# Patient Record
Sex: Female | Born: 1980 | Race: White | Hispanic: No | Marital: Single | Smoking: Current some day smoker
Health system: Southern US, Community
[De-identification: ages and names within clinical notes are randomized; demographics above are authoritative.]

## PROBLEM LIST (undated history)

## (undated) DIAGNOSIS — F419 Anxiety disorder, unspecified: Secondary | ICD-10-CM

## (undated) DIAGNOSIS — G5600 Carpal tunnel syndrome, unspecified upper limb: Secondary | ICD-10-CM

## (undated) DIAGNOSIS — R87629 Unspecified abnormal cytological findings in specimens from vagina: Secondary | ICD-10-CM

## (undated) DIAGNOSIS — IMO0002 Reserved for concepts with insufficient information to code with codable children: Secondary | ICD-10-CM

## (undated) DIAGNOSIS — S129XXA Fracture of neck, unspecified, initial encounter: Secondary | ICD-10-CM

## (undated) DIAGNOSIS — R87619 Unspecified abnormal cytological findings in specimens from cervix uteri: Secondary | ICD-10-CM

## (undated) DIAGNOSIS — B009 Herpesviral infection, unspecified: Secondary | ICD-10-CM

## (undated) HISTORY — PX: NO PAST SURGERIES: SHX2092

## (undated) HISTORY — DX: Unspecified abnormal cytological findings in specimens from cervix uteri: R87.619

## (undated) HISTORY — DX: Reserved for concepts with insufficient information to code with codable children: IMO0002

## (undated) HISTORY — DX: Fracture of neck, unspecified, initial encounter: S12.9XXA

## (undated) HISTORY — DX: Anxiety disorder, unspecified: F41.9

---
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
## 2003-02-24 DIAGNOSIS — S129XXA Fracture of neck, unspecified, initial encounter: Secondary | ICD-10-CM

## 2003-02-24 HISTORY — DX: Fracture of neck, unspecified, initial encounter: S12.9XXA

## 2003-08-09 ENCOUNTER — Emergency Department (HOSPITAL_COMMUNITY): Admission: EM | Admit: 2003-08-09 | Discharge: 2003-08-09 | Payer: Self-pay | Admitting: Emergency Medicine

## 2004-01-22 ENCOUNTER — Ambulatory Visit: Payer: Self-pay | Admitting: Family Medicine

## 2004-11-25 ENCOUNTER — Other Ambulatory Visit: Admission: RE | Admit: 2004-11-25 | Discharge: 2004-11-25 | Payer: Self-pay | Admitting: Family Medicine

## 2005-11-17 ENCOUNTER — Emergency Department (HOSPITAL_COMMUNITY): Admission: EM | Admit: 2005-11-17 | Discharge: 2005-11-17 | Payer: Self-pay | Admitting: Emergency Medicine

## 2007-02-25 ENCOUNTER — Inpatient Hospital Stay (HOSPITAL_COMMUNITY): Admission: EM | Admit: 2007-02-25 | Discharge: 2007-02-27 | Payer: Self-pay | Admitting: Emergency Medicine

## 2007-05-22 ENCOUNTER — Inpatient Hospital Stay (HOSPITAL_COMMUNITY): Admission: EM | Admit: 2007-05-22 | Discharge: 2007-05-22 | Payer: Self-pay | Admitting: Emergency Medicine

## 2007-06-01 ENCOUNTER — Emergency Department (HOSPITAL_COMMUNITY): Admission: EM | Admit: 2007-06-01 | Discharge: 2007-06-01 | Payer: Self-pay | Admitting: Emergency Medicine

## 2008-01-17 ENCOUNTER — Emergency Department (HOSPITAL_COMMUNITY): Admission: EM | Admit: 2008-01-17 | Discharge: 2008-01-17 | Payer: Self-pay | Admitting: Emergency Medicine

## 2008-04-15 ENCOUNTER — Inpatient Hospital Stay (HOSPITAL_COMMUNITY): Admission: AD | Admit: 2008-04-15 | Discharge: 2008-04-15 | Payer: Self-pay | Admitting: Obstetrics and Gynecology

## 2008-04-15 ENCOUNTER — Inpatient Hospital Stay (HOSPITAL_COMMUNITY): Admission: AD | Admit: 2008-04-15 | Discharge: 2008-04-17 | Payer: Self-pay | Admitting: Obstetrics and Gynecology

## 2010-06-08 ENCOUNTER — Emergency Department (HOSPITAL_COMMUNITY)
Admission: EM | Admit: 2010-06-08 | Discharge: 2010-06-08 | Discharge status: Against Medical Advice | Payer: Medicaid Other | Attending: Emergency Medicine | Admitting: Emergency Medicine

## 2010-06-08 DIAGNOSIS — M542 Cervicalgia: Secondary | ICD-10-CM | POA: Insufficient documentation

## 2010-06-10 LAB — CBC
HCT: 33.6 % — ABNORMAL LOW (ref 36.0–46.0)
HCT: 39.4 % (ref 36.0–46.0)
Hemoglobin: 13.5 g/dL (ref 12.0–15.0)
MCHC: 34.4 g/dL (ref 30.0–36.0)
MCHC: 35.2 g/dL (ref 30.0–36.0)
MCV: 95.5 fL (ref 78.0–100.0)
RBC: 3.52 MIL/uL — ABNORMAL LOW (ref 3.87–5.11)
RBC: 4.17 MIL/uL (ref 3.87–5.11)
RDW: 12.3 % (ref 11.5–15.5)
WBC: 13.4 10*3/uL — ABNORMAL HIGH (ref 4.0–10.5)
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
## 2010-10-26 ENCOUNTER — Emergency Department (HOSPITAL_COMMUNITY)
Admission: EM | Admit: 2010-10-26 | Discharge: 2010-10-26 | Disposition: A | Discharge status: Home / Self Care | Payer: Medicaid Other | Attending: Emergency Medicine | Admitting: Emergency Medicine

## 2010-10-26 DIAGNOSIS — F3289 Other specified depressive episodes: Secondary | ICD-10-CM | POA: Insufficient documentation

## 2010-10-26 DIAGNOSIS — F329 Major depressive disorder, single episode, unspecified: Secondary | ICD-10-CM | POA: Insufficient documentation

## 2010-10-26 DIAGNOSIS — F1010 Alcohol abuse, uncomplicated: Secondary | ICD-10-CM | POA: Insufficient documentation

## 2010-10-26 DIAGNOSIS — S51809A Unspecified open wound of unspecified forearm, initial encounter: Secondary | ICD-10-CM | POA: Insufficient documentation

## 2010-10-26 DIAGNOSIS — W268XXA Contact with other sharp object(s), not elsewhere classified, initial encounter: Secondary | ICD-10-CM | POA: Insufficient documentation

## 2010-11-13 LAB — BASIC METABOLIC PANEL
BUN: 9
CO2: 27
Calcium: 9.6
Chloride: 103
Creatinine, Ser: 0.61
GFR calc Af Amer: >60

## 2010-11-13 LAB — CBC
MCHC: 35
MCV: 92.2
Platelets: 307
RBC: 4.18
RDW: 12.1

## 2010-11-13 LAB — PROTIME-INR
INR: 1
Prothrombin Time: 12.9

## 2010-11-13 LAB — TYPE AND SCREEN: Antibody Screen: NEGATIVE

## 2010-11-17 LAB — BASIC METABOLIC PANEL
BUN: 3 — ABNORMAL LOW
CO2: 25
Calcium: 9.1
Creatinine, Ser: 0.72
GFR calc non Af Amer: >60
Glucose, Bld: 87
Sodium: 143

## 2010-11-17 LAB — RAPID URINE DRUG SCREEN, HOSP PERFORMED
Amphetamines: NOT DETECTED
Barbiturates: NOT DETECTED
Benzodiazepines: POSITIVE — AB
Cocaine: NOT DETECTED
Opiates: NOT DETECTED
Tetrahydrocannabinol: NOT DETECTED

## 2010-11-17 LAB — DIFFERENTIAL
Basophils Absolute: 0
Basophils Relative: 0
Lymphocytes Relative: 40
Monocytes Absolute: 0.3
Neutro Abs: 3.1

## 2010-11-17 LAB — ACETAMINOPHEN LEVEL: Acetaminophen (Tylenol), Serum: <10 — ABNORMAL LOW

## 2010-11-17 LAB — CBC
Hemoglobin: 13.2
MCHC: 35.5
Platelets: 272
RDW: 11.9

## 2010-11-18 LAB — POCT PREGNANCY, URINE: Operator id: 239701

## 2011-02-24 NOTE — L&D Delivery Note (Signed)
Delivery Note At 4:38 PM a viable and healthy female was delivered via Vaginal, Spontaneous Delivery (Presentation: Right Occiput Anterior).  APGAR: 3, 7; weight 6 lb 9.4 oz (2989 g).   Placenta status: Intact, Spontaneous.  Cord: 3 vessels with the following complications: None.  Cord pH: 7.22.  Mother had taken Valium and Xanax prior to coming in today in labor.  She received IV Stadol during labor.  Baby was vigorous at birth but developed secondary apnea at 1 minute and required resuscitation by NICU team.  Anesthesia: Epidural  Episiotomy: None Lacerations: None Est. Blood Loss (mL): 300  Mom to postpartum.  Baby to NICU for observation.  Luiz Trumpower D 10/31/2011, 7:43 PM

## 2011-04-15 ENCOUNTER — Other Ambulatory Visit: Payer: Self-pay

## 2011-04-15 LAB — OB RESULTS CONSOLE GC/CHLAMYDIA
Chlamydia: NEGATIVE
Gonorrhea: NEGATIVE

## 2011-04-15 LAB — OB RESULTS CONSOLE RPR: RPR: NONREACTIVE

## 2011-04-15 LAB — OB RESULTS CONSOLE HIV ANTIBODY (ROUTINE TESTING): HIV: NONREACTIVE

## 2011-04-15 LAB — OB RESULTS CONSOLE HEPATITIS B SURFACE ANTIGEN: Hepatitis B Surface Ag: NEGATIVE

## 2011-04-15 LAB — OB RESULTS CONSOLE ABO/RH

## 2011-04-20 ENCOUNTER — Other Ambulatory Visit (HOSPITAL_COMMUNITY): Payer: Self-pay | Admitting: Obstetrics and Gynecology

## 2011-04-20 DIAGNOSIS — O289 Unspecified abnormal findings on antenatal screening of mother: Secondary | ICD-10-CM

## 2011-04-22 ENCOUNTER — Ambulatory Visit (HOSPITAL_COMMUNITY): Payer: Medicaid Other

## 2011-04-22 ENCOUNTER — Ambulatory Visit (HOSPITAL_COMMUNITY): Payer: Medicaid Other | Attending: Obstetrics and Gynecology

## 2011-04-29 ENCOUNTER — Ambulatory Visit (HOSPITAL_COMMUNITY): Payer: Medicaid Other | Attending: Obstetrics and Gynecology

## 2011-04-29 ENCOUNTER — Ambulatory Visit (HOSPITAL_COMMUNITY): Payer: Medicaid Other

## 2011-05-13 ENCOUNTER — Other Ambulatory Visit (HOSPITAL_COMMUNITY): Payer: Self-pay | Admitting: Obstetrics and Gynecology

## 2011-05-13 DIAGNOSIS — O289 Unspecified abnormal findings on antenatal screening of mother: Secondary | ICD-10-CM

## 2011-05-19 ENCOUNTER — Ambulatory Visit (HOSPITAL_COMMUNITY)
Admission: RE | Admit: 2011-05-19 | Discharge: 2011-05-19 | Disposition: A | Discharge status: Home / Self Care | Payer: Medicaid Other | Source: Ambulatory Visit | Attending: Obstetrics and Gynecology | Admitting: Obstetrics and Gynecology

## 2011-05-19 ENCOUNTER — Other Ambulatory Visit (HOSPITAL_COMMUNITY): Payer: Self-pay | Admitting: Obstetrics and Gynecology

## 2011-05-19 VITALS — BP 97/56 | HR 92 | Wt 129.0 lb

## 2011-05-19 DIAGNOSIS — O289 Unspecified abnormal findings on antenatal screening of mother: Secondary | ICD-10-CM | POA: Insufficient documentation

## 2011-05-19 DIAGNOSIS — Z363 Encounter for antenatal screening for malformations: Secondary | ICD-10-CM | POA: Insufficient documentation

## 2011-05-19 DIAGNOSIS — O358XX0 Maternal care for other (suspected) fetal abnormality and damage, not applicable or unspecified: Secondary | ICD-10-CM | POA: Insufficient documentation

## 2011-05-19 DIAGNOSIS — Z1389 Encounter for screening for other disorder: Secondary | ICD-10-CM | POA: Insufficient documentation

## 2011-05-19 NOTE — Progress Notes (Signed)
Obstetric ultrasound performed today.  Reassuring fetal findings.  Some limitations due to early gestational age.   Patient also received genetic counseling (see separate report).  She declined further testing for fetal chromosome abnormalities at this time, but may wish to consider these again at her next ultrasound appointment.    The option of fetal echo was also discussed today due to increased nuchal translucency measurements (98th %tile for gestational age).  She is considering this option as well.   Patient also expressed some increased symptoms of anxiety.  She has taken Xanax in the past but reports avoiding this since the diagnosis of her pregnancy.  Information was provided regarding community mental health services and she was strongly advised to establish care with a mental health provider.  Concerning signs and symptoms were reviewed and precautions given.  Patient denies suicidal or homicidal ideation today.   Follow up ultrasound exam scheduled in 3 weeks.    Please see full report in [facility].

## 2011-05-19 NOTE — Progress Notes (Signed)
Genetic Counseling  High-Risk Gestation Note  Appointment Date:  05/19/2011 Referred By: Levi Aland, MD Date of Birth:  04/23/1980 Partner:  Odette Fraction Attending: Rica Koyanagi, MD   [name] and her partner, Mr. Odette Fraction, were seen for genetic counseling because of an increased risk for fetal Down syndrome based on First Trimester Screening performed through [facility].  They were counseled regarding the First trimester screen result and the associated 1 in 216 risk for fetal Down syndrome.  In addition, we reviewed the screen adjusted increase in risk for trisomy 42 (1 in 219) but discussed that this is considered in the screen negative range. We reviewed chromosomes, nondisjunction, and the common features and variable prognosis of Down syndrome as well as features and poor prognosis of trisomy 2.  We also discussed other explanations for a screen positive result including: a gestational dating error, differences in maternal metabolism, and normal variation. We specifically reviewed that the nuchal translucency measurement obtained for her first trimester screen was reported to be at the 98%tile.   We reviewed the various common etiologies for an increased NT including: aneuploidy, single gene conditions, cardiac or great vessel abnormalities, lymphatic system failure, decreased fetal movement, and fetal anemia.  We reviewed that the associated risk for a cardiac defect is ~1% based on the reported NT measurement.  We also discussed single gene conditions and that these conditions are not routinely tested for prenatally unless ultrasound findings or family history significantly increase the suspicion of a specific single gene disorder.    We reviewed other available screening options including detailed ultrasound and fetal echocardiogram. We discussed another type of screening test, noninvasive prenatal testing (NIPT), which utilizes cell free fetal DNA found in the maternal  circulation. This test is not diagnostic for chromosome conditions, but can provide information regarding the presence or absence of extra fetal DNA for chromosomes 13, 18 and 21. Thus, it would not identify or rule out all fetal aneuploidy. The reported detection rate is greater than 99% for Trisomy 21, greater than 97% for Trisomy 18, and is approximately 80% (8 out of 10) for Trisomy 13. The false positive rate is reported to be less than 1% for any of these conditions. We also reviewed the available diagnostic option of amniocentesis. A risk of 1 in 200-300 was given for amniocentesis, the primary concern being risk for spontaneous pregnancy loss. We discussed the risks, limitations, and benefits of each.    After thoughtful consideration of these options, Elaine Wallace elected to have ultrasound, but declined amniocentesis and cell free fetal DNA testing at this time. She is further considering cell free fetal DNA testing ([facility]) and will contact our office should she elect to pursue this testing. She understands that ultrasound cannot rule out all birth defects or genetic syndromes.  The patient was advised of this limitation and states she still does not want diagnostic testing at this time.   However, they were counseled that 50-80% of fetuses with Down syndrome, when well visualized, have detectable anomalies or soft markers by ultrasound.  A complete ultrasound was performed today.  The ultrasound report will be sent under separate cover. Given early gestational age, a follow-up ultrasound was planned on 06/03/11 to complete fetal anatomic survey. Fetal echocardiogram is also available to the patient in the pregnancy, if desired, given the first trimester increased nuchal translucency measurement.   Elaine Wallace was provided with written information regarding cystic fibrosis (CF) including the carrier frequency  and incidence in the Caucasian population, the availability of carrier testing and  prenatal diagnosis if indicated.  In addition, we discussed that CF is routinely screened for as part of the [place] newborn screening panel.  She declined testing today.   Both family histories were reviewed and found to be noncontributory for birth defects, mental retardation, recurrent pregnancy loss, and known genetic conditions. Without further information regarding the provided family history, an accurate genetic risk cannot be calculated. Further genetic counseling is warranted if more information is obtained. The father of the pregnancy is 38 years old. There is an approximate 0.5% risk for the fetus to have a new autosomal dominant gene mutation related to a paternal age of over 36. There is a wide range of conditions which can be caused by new dominant gene mutations.  However, genetic testing for advanced paternal age is not available at this time.  Elaine Wallace denied exposure to environmental toxins. She denied the use of alcohol, tobacco or street drugs. She denied significant viral illnesses during the course of her pregnancy. Her medical and surgical histories were contributory for anxiety, for which she has taken xanax during the pregnancy. Available animal study data do not suggest an increased risk for birth defects with prenatal alprazolam (Xanax) use, except at very high dose exposure. Available human data regarding Xanax use in pregnancy have not indicated an increased risk for birth defects. We reviewed that other benzodiazepines have been suggested to increase the risk for birth defects, such as cleft lip and palate. However, this association has not been proven. We discussed that third trimester use of alprazolam is associated with an increased risk for neonatal withdrawal symptoms. Even though a limited number of medicines are known to cause birth defects, we cannot say that it is completely safe to use any medicines during pregnancy.  It is also not possible to predict any  drug-drug interactions that occur, or how they might affect a pregnancy.  The use of medications is recommended in pregnancy only if the benefit to the mother (and thus the pregnancy) outweighs potential risks to the baby.  Sometimes the maternal use of medications, dictated by a medical condition, may even be more beneficial to a pregnancy than not taking the medication(s) at all. Ms. Fotopoulos reported that she currently does not have a medical professional to help with her anxiety management. We provided Ms. Yehuda Mao with contact information for [facility] in [place], which offers outpatient services for treatment of anxiety.   I counseled this couple for approximately 40 minutes regarding the above risks and available options.     Quinn Plowman, MS,  Certified [employer] 05/19/2011

## 2011-05-25 ENCOUNTER — Encounter (HOSPITAL_COMMUNITY): Payer: Self-pay

## 2011-06-03 ENCOUNTER — Ambulatory Visit (HOSPITAL_COMMUNITY): Payer: Medicaid Other | Attending: Obstetrics and Gynecology

## 2011-10-15 ENCOUNTER — Encounter (HOSPITAL_COMMUNITY): Payer: Self-pay | Admitting: *Deleted

## 2011-10-15 ENCOUNTER — Telehealth (HOSPITAL_COMMUNITY): Payer: Self-pay | Admitting: *Deleted

## 2011-10-15 NOTE — Telephone Encounter (Signed)
Preadmission screen  

## 2011-10-31 ENCOUNTER — Inpatient Hospital Stay (HOSPITAL_COMMUNITY)
Admission: AD | Admit: 2011-10-31 | Discharge: 2011-11-02 | DRG: 775 | Disposition: A | Discharge status: Home / Self Care | Payer: Medicaid Other | Source: Ambulatory Visit | Attending: Obstetrics & Gynecology | Admitting: Obstetrics & Gynecology

## 2011-10-31 ENCOUNTER — Encounter (HOSPITAL_COMMUNITY): Payer: Self-pay | Admitting: Anesthesiology

## 2011-10-31 ENCOUNTER — Encounter (HOSPITAL_COMMUNITY): Payer: Self-pay

## 2011-10-31 ENCOUNTER — Inpatient Hospital Stay (HOSPITAL_COMMUNITY): Payer: Medicaid Other | Admitting: Anesthesiology

## 2011-10-31 DIAGNOSIS — Z3480 Encounter for supervision of other normal pregnancy, unspecified trimester: Secondary | ICD-10-CM

## 2011-10-31 LAB — CBC
Hemoglobin: 10.7 g/dL — ABNORMAL LOW (ref 12.0–15.0)
MCHC: 33.2 g/dL (ref 30.0–36.0)
Platelets: 175 10*3/uL (ref 150–400)
RBC: 3.57 MIL/uL — ABNORMAL LOW (ref 3.87–5.11)

## 2011-10-31 LAB — RPR: RPR Ser Ql: NONREACTIVE

## 2011-10-31 MED ORDER — LACTATED RINGERS IV SOLN
500.0000 mL | Freq: Once | INTRAVENOUS | Status: AC
  Administered 2011-10-31: 15:00:00 via INTRAVENOUS

## 2011-10-31 MED ORDER — LIDOCAINE HCL (PF) 1 % IJ SOLN
30.0000 mL | INTRAMUSCULAR | Status: DC | PRN
  Filled 2011-10-31: qty 30

## 2011-10-31 MED ORDER — EPHEDRINE 5 MG/ML INJ: 10.0000 mg | INTRAVENOUS | Status: DC | PRN

## 2011-10-31 MED ORDER — CITRIC ACID-SODIUM CITRATE 334-500 MG/5ML PO SOLN: 30.0000 mL | ORAL | Status: DC | PRN

## 2011-10-31 MED ORDER — ONDANSETRON HCL 4 MG/2ML IJ SOLN: 4.0000 mg | INTRAMUSCULAR | Status: DC | PRN

## 2011-10-31 MED ORDER — ALPRAZOLAM 0.25 MG PO TABS
0.2500 mg | ORAL_TABLET | Freq: Every day | ORAL | Status: DC | PRN
  Administered 2011-11-02: 0.25 mg via ORAL
  Filled 2011-10-31: qty 1

## 2011-10-31 MED ORDER — LACTATED RINGERS IV SOLN
500.0000 mL | INTRAVENOUS | Status: DC | PRN
  Administered 2011-10-31: 1000 mL via INTRAVENOUS

## 2011-10-31 MED ORDER — ONDANSETRON HCL 4 MG/2ML IJ SOLN: 4.0000 mg | Freq: Four times a day (QID) | INTRAMUSCULAR | Status: DC | PRN

## 2011-10-31 MED ORDER — ZOLPIDEM TARTRATE 5 MG PO TABS: 5.0000 mg | ORAL_TABLET | Freq: Every evening | ORAL | Status: DC | PRN

## 2011-10-31 MED ORDER — LACTATED RINGERS IV SOLN: INTRAVENOUS | Status: DC

## 2011-10-31 MED ORDER — ONDANSETRON HCL 4 MG PO TABS: 4.0000 mg | ORAL_TABLET | ORAL | Status: DC | PRN

## 2011-10-31 MED ORDER — PHENYLEPHRINE 40 MCG/ML (10ML) SYRINGE FOR IV PUSH (FOR BLOOD PRESSURE SUPPORT)
80.0000 ug | PREFILLED_SYRINGE | INTRAVENOUS | Status: DC | PRN
  Administered 2011-10-31: 80 ug via INTRAVENOUS
  Filled 2011-10-31: qty 5

## 2011-10-31 MED ORDER — EPHEDRINE 5 MG/ML INJ
10.0000 mg | INTRAVENOUS | Status: DC | PRN
  Filled 2011-10-31: qty 4

## 2011-10-31 MED ORDER — NALOXONE HCL 0.4 MG/ML IJ SOLN
INTRAMUSCULAR | Status: AC
  Filled 2011-10-31: qty 1

## 2011-10-31 MED ORDER — LANOLIN HYDROUS EX OINT: TOPICAL_OINTMENT | CUTANEOUS | Status: DC | PRN

## 2011-10-31 MED ORDER — LIDOCAINE HCL (PF) 1 % IJ SOLN
INTRAMUSCULAR | Status: DC | PRN
  Administered 2011-10-31 (×4): 4 mL

## 2011-10-31 MED ORDER — WITCH HAZEL-GLYCERIN EX PADS: 1.0000 "application " | MEDICATED_PAD | CUTANEOUS | Status: DC | PRN

## 2011-10-31 MED ORDER — SIMETHICONE 80 MG PO CHEW: 80.0000 mg | CHEWABLE_TABLET | ORAL | Status: DC | PRN

## 2011-10-31 MED ORDER — DIPHENHYDRAMINE HCL 25 MG PO CAPS: 25.0000 mg | ORAL_CAPSULE | Freq: Four times a day (QID) | ORAL | Status: DC | PRN

## 2011-10-31 MED ORDER — IBUPROFEN 600 MG PO TABS
600.0000 mg | ORAL_TABLET | Freq: Four times a day (QID) | ORAL | Status: DC | PRN
  Administered 2011-10-31: 600 mg via ORAL
  Filled 2011-10-31: qty 1

## 2011-10-31 MED ORDER — BENZOCAINE-MENTHOL 20-0.5 % EX AERO: 1.0000 "application " | INHALATION_SPRAY | CUTANEOUS | Status: DC | PRN

## 2011-10-31 MED ORDER — FLEET ENEMA 7-19 GM/118ML RE ENEM: 1.0000 | ENEMA | RECTAL | Status: DC | PRN

## 2011-10-31 MED ORDER — DIPHENHYDRAMINE HCL 50 MG/ML IJ SOLN: 12.5000 mg | INTRAMUSCULAR | Status: DC | PRN

## 2011-10-31 MED ORDER — TETANUS-DIPHTH-ACELL PERTUSSIS 5-2.5-18.5 LF-MCG/0.5 IM SUSP
0.5000 mL | Freq: Once | INTRAMUSCULAR | Status: AC
  Administered 2011-11-02: 0.5 mL via INTRAMUSCULAR

## 2011-10-31 MED ORDER — BUTORPHANOL TARTRATE 1 MG/ML IJ SOLN
1.0000 mg | INTRAMUSCULAR | Status: DC | PRN
  Administered 2011-10-31 (×2): 1 mg via INTRAVENOUS
  Filled 2011-10-31 (×2): qty 1

## 2011-10-31 MED ORDER — OXYCODONE-ACETAMINOPHEN 5-325 MG PO TABS: 1.0000 | ORAL_TABLET | ORAL | Status: DC | PRN

## 2011-10-31 MED ORDER — DIBUCAINE 1 % RE OINT: 1.0000 "application " | TOPICAL_OINTMENT | RECTAL | Status: DC | PRN

## 2011-10-31 MED ORDER — IBUPROFEN 600 MG PO TABS
600.0000 mg | ORAL_TABLET | Freq: Four times a day (QID) | ORAL | Status: DC
  Administered 2011-10-31 – 2011-11-02 (×6): 600 mg via ORAL
  Filled 2011-10-31 (×6): qty 1

## 2011-10-31 MED ORDER — OXYTOCIN BOLUS FROM INFUSION
500.0000 mL | Freq: Once | INTRAVENOUS | Status: DC
  Filled 2011-10-31: qty 500

## 2011-10-31 MED ORDER — PHENYLEPHRINE 40 MCG/ML (10ML) SYRINGE FOR IV PUSH (FOR BLOOD PRESSURE SUPPORT): 80.0000 ug | PREFILLED_SYRINGE | INTRAVENOUS | Status: DC | PRN

## 2011-10-31 MED ORDER — FENTANYL 2.5 MCG/ML BUPIVACAINE 1/10 % EPIDURAL INFUSION (WH - ANES)
14.0000 mL/h | INTRAMUSCULAR | Status: DC
  Administered 2011-10-31: 14 mL/h via EPIDURAL
  Filled 2011-10-31: qty 60

## 2011-10-31 MED ORDER — OXYTOCIN 40 UNITS IN LACTATED RINGERS INFUSION - SIMPLE MED
62.5000 mL/h | Freq: Once | INTRAVENOUS | Status: AC
  Administered 2011-10-31: 62.5 mL/h via INTRAVENOUS
  Filled 2011-10-31: qty 1000

## 2011-10-31 MED ORDER — SENNOSIDES-DOCUSATE SODIUM 8.6-50 MG PO TABS
2.0000 | ORAL_TABLET | Freq: Every day | ORAL | Status: DC
  Administered 2011-10-31 – 2011-11-01 (×2): 2 via ORAL

## 2011-10-31 MED ORDER — PRENATAL MULTIVITAMIN CH
1.0000 | ORAL_TABLET | Freq: Every day | ORAL | Status: DC
  Administered 2011-11-01 – 2011-11-02 (×2): 1 via ORAL
  Filled 2011-10-31 (×2): qty 1

## 2011-10-31 MED ORDER — ACETAMINOPHEN 325 MG PO TABS: 650.0000 mg | ORAL_TABLET | ORAL | Status: DC | PRN

## 2011-10-31 NOTE — Progress Notes (Signed)
Delivery of live viable female, NICU called to delivery. APGARS 3,7

## 2011-10-31 NOTE — H&P (Addendum)
31 y.o. G2P1001  Estimated Date of Delivery: 10/29/11 admitted at 40/[redacted] weeks gestation in labor.  Prenatal Transfer Tool  Maternal Diabetes: No Genetic Screening: Abnormal:  Results: Elevated risk of Trisomy 21, normal ultrasound at [facility]. Maternal Ultrasounds/Referrals: Normal Fetal Ultrasounds or other Referrals:  Referred to Materal Fetal Medicine , normal - no markers for aneuploidy. Maternal Substance Abuse:  No Significant Maternal Medications:  Meds include: Other: xanax in first trimester. Significant Maternal Lab Results: Lab values include: Rh negative, received RhoGAM at 28 weeks. Other Significant Pregnancy Complications:  None  Afebrile, VSS Heart and Lungs: No active disease Abdomen: soft, gravid, EFW AGA. Cervical exam:  6/90  Impression: Active labor  Plan:  Admit to [facility].
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
## 2011-10-31 NOTE — Anesthesia Procedure Notes (Signed)
Epidural Patient location during procedure: OB Start time: 10/31/2011 3:27 PM  Staffing Performed by: anesthesiologist   Preanesthetic Checklist Completed: patient identified, site marked, surgical consent, pre-op evaluation, timeout performed, IV checked, risks and benefits discussed and monitors and equipment checked  Epidural Patient position: sitting Prep: site prepped and draped and DuraPrep Patient monitoring: continuous pulse ox and blood pressure Approach: midline Injection technique: LOR air  Needle:  Needle type: Tuohy  Needle gauge: 17 G Needle length: 9 cm and 9 Needle insertion depth: 5 cm cm Catheter type: closed end flexible Catheter size: 19 Gauge Catheter at skin depth: 10 cm Test dose: negative  Assessment Events: blood not aspirated, injection not painful, no injection resistance, negative IV test and no paresthesia  Additional Notes Patient declines discussion of risks and benefits.  Jasmine December, MDReason for block:procedure for pain

## 2011-11-01 ENCOUNTER — Encounter (HOSPITAL_COMMUNITY): Payer: Self-pay | Admitting: *Deleted

## 2011-11-01 LAB — CBC
MCHC: 33.3 g/dL (ref 30.0–36.0)
Platelets: 161 10*3/uL (ref 150–400)
RDW: 13.9 % (ref 11.5–15.5)

## 2011-11-01 MED ORDER — RHO D IMMUNE GLOBULIN 1500 UNIT/2ML IJ SOLN
300.0000 ug | Freq: Once | INTRAMUSCULAR | Status: AC
  Administered 2011-11-01: 300 ug via INTRAMUSCULAR
  Filled 2011-11-01: qty 2

## 2011-11-01 MED ORDER — HYDROCODONE-ACETAMINOPHEN 5-325 MG PO TABS
1.0000 | ORAL_TABLET | ORAL | Status: DC | PRN
  Administered 2011-11-01: 1 via ORAL
  Administered 2011-11-01: 2 via ORAL
  Administered 2011-11-01: 1 via ORAL
  Filled 2011-11-01 (×2): qty 2
  Filled 2011-11-01: qty 1
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
## 2011-11-01 NOTE — Anesthesia Postprocedure Evaluation (Signed)
  Anesthesia Post-op Note  Patient: Elaine Wallace  Procedure(s) Performed: * No procedures listed *  Patient Location: Mother/Baby  Anesthesia Type: Epidural  Level of Consciousness: awake, alert  and oriented  Airway and Oxygen Therapy: Patient Spontanous Breathing  Post-op Pain: mild  Post-op Assessment: Patient's Cardiovascular Status Stable, Respiratory Function Stable, Patent Airway, No signs of Nausea or vomiting and Pain level controlled  Post-op Vital Signs: stable  Complications: none

## 2011-11-01 NOTE — Progress Notes (Signed)
Post Partum Day 1 Subjective: no complaints  Objective: Blood pressure 89/62, pulse 63, temperature 97.8 F (36.6 C), temperature source Oral, resp. rate 18, height 5\' 4"  (1.626 m), weight 64.864 kg (143 lb), SpO2 99.00%, unknown if currently breastfeeding.  Physical Exam:  General: alert Lochia: appropriate Uterine Fundus: firm   Basename 11/01/11 0540 10/31/11 1415  HGB 9.6* 10.7*  HCT 28.8* 32.2*    Assessment/Plan: Plan for discharge tomorrow.  Baby doing well and may leave NICU today.   LOS: 1 day   Raylan Troiani D 11/01/2011, 11:31 AM

## 2011-11-02 LAB — RH IG WORKUP (INCLUDES ABO/RH)
ABO/RH(D): O NEG
Gestational Age(Wks): 40

## 2011-11-02 MED ORDER — HYDROCODONE-ACETAMINOPHEN 5-325 MG PO TABS: 1.0000 | ORAL_TABLET | ORAL | Status: AC | PRN

## 2011-11-02 NOTE — Progress Notes (Signed)
Pt with  questions about PP Depression.  Talked to pt about reaching out to others if she feels depressed, not to hesitate to call her doctors with any questions or  Concerns. .  Resources given at d/c, pt verbalizes understanding of d/c instructions.  Pt explains she was anxious during her pregnancy, she had broken her foot and was on pain medication for that.  She was worried she had compromised her newborn's health.  She verbalized she did not want to continue on the Xanax, b/c she wanted to breast feed her baby.  She stated she would call her md, if she started to feel anxious.  She did express financial dificulties and wanted me to give her some medications to take with her.  I told her I was unable to do that.  She said she would go by the office to get her prescription signed by Dr. Dareen Piano.                  MT Sherrell Weir RN

## 2011-11-02 NOTE — Discharge Summary (Signed)
Obstetric Discharge Summary Reason for Admission: onset of labor Prenatal Procedures: ultrasound Intrapartum Procedures: spontaneous vaginal delivery Postpartum Procedures: none Complications-Operative and Postpartum: none Hemoglobin  Date Value Range Status  11/01/2011 9.6* 12.0 - 15.0 g/dL Final     HCT  Date Value Range Status  11/01/2011 28.8* 36.0 - 46.0 % Final    Physical Exam:  General: alert Lochia: appropriate Uterine Fundus: firm   Discharge Diagnoses: Term Pregnancy-delivered  Discharge Information: Date: 11/02/2011 Activity: pelvic rest Diet: routine Medications: PNV, Ibuprofen and Vicodin Condition: stable Instructions: refer to practice specific booklet Discharge to: home Follow-up Information    Follow up with Mickel Baas, MD. Schedule an appointment as soon as possible for a visit in 4 weeks.   Contact information:   [address] [place] [address] [phone]          Newborn Data: Live born female  Birth Weight: 6 lb 9.4 oz (2989 g) APGAR: 3, 7  Home with mother.  ANDERSON,MARK E 11/02/2011, 12:31 PM

## 2011-11-02 NOTE — Progress Notes (Signed)
PPD#2 Pt and baby are stable. Mother ready to go home. VSSAF Lochia-mild IMP/ s/p SVD PLAN/ Will discharge.

## 2011-11-02 NOTE — Progress Notes (Signed)
Chart review completed.

## 2011-11-03 LAB — TYPE AND SCREEN
ABO/RH(D): O NEG
Unit division: 0

## 2011-12-29 ENCOUNTER — Ambulatory Visit: Payer: Medicaid Other | Admitting: Family Medicine

## 2011-12-29 DIAGNOSIS — Z0289 Encounter for other administrative examinations: Secondary | ICD-10-CM

## 2013-05-15 ENCOUNTER — Encounter (HOSPITAL_COMMUNITY): Payer: Self-pay | Admitting: Emergency Medicine

## 2013-05-15 ENCOUNTER — Emergency Department (HOSPITAL_COMMUNITY): Payer: Medicaid Other

## 2013-05-15 ENCOUNTER — Emergency Department (HOSPITAL_COMMUNITY)
Admission: EM | Admit: 2013-05-15 | Discharge: 2013-05-16 | Disposition: A | Discharge status: Home / Self Care | Payer: Medicaid Other | Attending: Emergency Medicine | Admitting: Emergency Medicine

## 2013-05-15 DIAGNOSIS — Z3202 Encounter for pregnancy test, result negative: Secondary | ICD-10-CM | POA: Insufficient documentation

## 2013-05-15 DIAGNOSIS — Z79899 Other long term (current) drug therapy: Secondary | ICD-10-CM | POA: Diagnosis not present

## 2013-05-15 DIAGNOSIS — S199XXA Unspecified injury of neck, initial encounter: Secondary | ICD-10-CM

## 2013-05-15 DIAGNOSIS — Z880 Allergy status to penicillin: Secondary | ICD-10-CM | POA: Insufficient documentation

## 2013-05-15 DIAGNOSIS — F1310 Sedative, hypnotic or anxiolytic abuse, uncomplicated: Secondary | ICD-10-CM | POA: Insufficient documentation

## 2013-05-15 DIAGNOSIS — S0083XA Contusion of other part of head, initial encounter: Secondary | ICD-10-CM

## 2013-05-15 DIAGNOSIS — F329 Major depressive disorder, single episode, unspecified: Secondary | ICD-10-CM

## 2013-05-15 DIAGNOSIS — Y9301 Activity, walking, marching and hiking: Secondary | ICD-10-CM | POA: Insufficient documentation

## 2013-05-15 DIAGNOSIS — F1010 Alcohol abuse, uncomplicated: Secondary | ICD-10-CM | POA: Diagnosis not present

## 2013-05-15 DIAGNOSIS — Z008 Encounter for other general examination: Secondary | ICD-10-CM | POA: Diagnosis present

## 2013-05-15 DIAGNOSIS — S058X9A Other injuries of unspecified eye and orbit, initial encounter: Secondary | ICD-10-CM | POA: Diagnosis not present

## 2013-05-15 DIAGNOSIS — F10929 Alcohol use, unspecified with intoxication, unspecified: Secondary | ICD-10-CM

## 2013-05-15 DIAGNOSIS — F411 Generalized anxiety disorder: Secondary | ICD-10-CM | POA: Diagnosis not present

## 2013-05-15 DIAGNOSIS — F3289 Other specified depressive episodes: Secondary | ICD-10-CM | POA: Diagnosis not present

## 2013-05-15 DIAGNOSIS — S0003XA Contusion of scalp, initial encounter: Secondary | ICD-10-CM | POA: Diagnosis not present

## 2013-05-15 DIAGNOSIS — W1809XA Striking against other object with subsequent fall, initial encounter: Secondary | ICD-10-CM | POA: Insufficient documentation

## 2013-05-15 DIAGNOSIS — Y929 Unspecified place or not applicable: Secondary | ICD-10-CM | POA: Insufficient documentation

## 2013-05-15 DIAGNOSIS — S0993XA Unspecified injury of face, initial encounter: Secondary | ICD-10-CM | POA: Diagnosis not present

## 2013-05-15 DIAGNOSIS — F4321 Adjustment disorder with depressed mood: Secondary | ICD-10-CM

## 2013-05-15 DIAGNOSIS — F32A Depression, unspecified: Secondary | ICD-10-CM

## 2013-05-15 DIAGNOSIS — S1093XA Contusion of unspecified part of neck, initial encounter: Secondary | ICD-10-CM

## 2013-05-15 LAB — COMPREHENSIVE METABOLIC PANEL
ALBUMIN: 4.6 g/dL (ref 3.5–5.2)
ALT: 12 U/L (ref 0–35)
AST: 23 U/L (ref 0–37)
Alkaline Phosphatase: 50 U/L (ref 39–117)
BUN: 8 mg/dL (ref 6–23)
CALCIUM: 9.6 mg/dL (ref 8.4–10.5)
CO2: 25 mEq/L (ref 19–32)
Chloride: 105 mEq/L (ref 96–112)
Creatinine, Ser: 0.57 mg/dL (ref 0.50–1.10)
GFR calc non Af Amer: >90 mL/min (ref >90)
GLUCOSE: 102 mg/dL — AB (ref 70–99)
Potassium: 4.2 mEq/L (ref 3.7–5.3)
SODIUM: 144 meq/L (ref 137–147)
TOTAL PROTEIN: 7.9 g/dL (ref 6.0–8.3)
Total Bilirubin: 0.2 mg/dL — ABNORMAL LOW (ref 0.3–1.2)

## 2013-05-15 LAB — POC URINE PREG, ED: Preg Test, Ur: NEGATIVE

## 2013-05-15 LAB — CBC
HCT: 41.2 % (ref 36.0–46.0)
HEMOGLOBIN: 14.4 g/dL (ref 12.0–15.0)
MCH: 32.1 pg (ref 26.0–34.0)
MCHC: 35 g/dL (ref 30.0–36.0)
MCV: 92 fL (ref 78.0–100.0)
PLATELETS: 243 10*3/uL (ref 150–400)
RBC: 4.48 MIL/uL (ref 3.87–5.11)
RDW: 11.9 % (ref 11.5–15.5)
WBC: 4.4 10*3/uL (ref 4.0–10.5)

## 2013-05-15 LAB — RAPID URINE DRUG SCREEN, HOSP PERFORMED
Amphetamines: NOT DETECTED
BARBITURATES: NOT DETECTED
BENZODIAZEPINES: POSITIVE — AB
COCAINE: NOT DETECTED
Opiates: NOT DETECTED
Tetrahydrocannabinol: NOT DETECTED

## 2013-05-15 LAB — ETHANOL: Alcohol, Ethyl (B): 324 mg/dL — ABNORMAL HIGH (ref 0–11)

## 2013-05-15 MED ORDER — PANTOPRAZOLE SODIUM 40 MG PO TBEC
80.0000 mg | DELAYED_RELEASE_TABLET | Freq: Every day | ORAL | Status: DC
  Administered 2013-05-16: 80 mg via ORAL
  Filled 2013-05-15: qty 2

## 2013-05-15 MED ORDER — ALPRAZOLAM 0.25 MG PO TABS
0.2500 mg | ORAL_TABLET | Freq: Every day | ORAL | Status: DC | PRN
  Administered 2013-05-16: 0.25 mg via ORAL
  Filled 2013-05-15: qty 1

## 2013-05-15 MED ORDER — NICOTINE 21 MG/24HR TD PT24: 21.0000 mg | MEDICATED_PATCH | Freq: Every day | TRANSDERMAL | Status: DC

## 2013-05-15 MED ORDER — IBUPROFEN 200 MG PO TABS
600.0000 mg | ORAL_TABLET | Freq: Three times a day (TID) | ORAL | Status: DC | PRN
  Administered 2013-05-16: 600 mg via ORAL
  Filled 2013-05-15: qty 3

## 2013-05-15 MED ORDER — SUCRALFATE 1 G PO TABS
1.0000 g | ORAL_TABLET | Freq: Three times a day (TID) | ORAL | Status: DC
  Administered 2013-05-16 (×2): 1 g via ORAL
  Filled 2013-05-15 (×6): qty 1

## 2013-05-15 MED ORDER — ZOLPIDEM TARTRATE 5 MG PO TABS: 5.0000 mg | ORAL_TABLET | Freq: Every evening | ORAL | Status: DC | PRN
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
## 2013-05-15 NOTE — ED Provider Notes (Signed)
CSN: [id number]     Arrival date & time 05/15/13  2059 History  This chart was scribed for non-physician practitioner, Ivonne AndrewPeter Ylianna Almanzar, PA-C working with Brandt LoosenJulie Manly, MD by Luisa DagoPriscilla Tutu, ED scribe. This patient was seen in room [facility]TR3/[facility]PT3 and the patient's care was started at 11:33 PM.    Chief Complaint  Patient presents with  . Medical Clearance   The history is provided by the patient. No language interpreter was used.   HPI Comments: Elaine GongJessica R Wallace is a 33 y.o. female who presents to the Emergency Department complaining of an injury that occurred 2.5 hours ago. Pt states that she was walking when she slipped and fell and hit her face on the edge of her night stand. Denies any LOC. Pt is also complaining of associated neck pain. Patient does admit to alcohol use this evening. She does state that she's been feeling sad and depressed. EMS had reported that patient expressed suicidal ideation.  Pt however is currently denying any suicidal ideations to me at this time. Denies any other complaints. Denies any previous SI or suicide attempt. Denies any fever, chills, or diaphoresis. Denies dizziness, lightheadedness, weakness or numbness in extremities.    Past Medical History  Diagnosis Date  . Abnormal Pap smear   . Broken neck 2005  . Anxiety    History reviewed. No pertinent past surgical history. Family History  Problem Relation Age of Onset  . Depression Mother   . Alcohol abuse Maternal Grandmother   . COPD Maternal Grandmother   . Alcohol abuse Maternal Grandfather   . Diabetes Paternal Grandmother   . Cancer Paternal Grandfather     lung  . COPD Father    History  Substance Use Topics  . Smoking status: Never Smoker   . Smokeless tobacco: Not on file  . Alcohol Use: Yes   OB History   Grav Para Term Preterm Abortions TAB SAB Ect Mult Living   2 2 2       2      Review of Systems  Constitutional: Negative for fever, chills and diaphoresis.  Skin: Positive for wound.   All other systems reviewed and are negative.      Allergies  Penicillins  Home Medications   Current Outpatient Rx  Name  Route  Sig  Dispense  Refill  . ALPRAZolam (XANAX) 0.5 MG tablet   Oral   Take 0.25 mg by mouth daily as needed for anxiety.         Marland Kitchen. omeprazole (PRILOSEC) 20 MG capsule   Oral   Take 40 mg by mouth daily.          . sucralfate (CARAFATE) 1 G tablet   Oral   Take 1 g by mouth 4 (four) times daily -  with meals and at bedtime.          BP 153/126  Pulse 95  Temp(Src) 98.1 F (36.7 C) (Oral)  Resp 20  SpO2 97%  LMP 05/08/2013  Physical Exam  Nursing note and vitals reviewed. Constitutional: She is oriented to person, place, and time. She appears well-developed and well-nourished.  HENT:  Head: Normocephalic and atraumatic.  Mouth/Throat: Oropharynx is clear and moist.  Contusion around the left periorbital area. Small subcentimeter superficial laceration below the left lateral eyelid. No bleeding.  Eyes: Conjunctivae and EOM are normal. Pupils are equal, round, and reactive to light.  Neck: Normal range of motion. Neck supple.  Mild posterior tenderness. No gross deformities or step-offs.  Cardiovascular: Normal rate.   Pulmonary/Chest: Effort normal. No respiratory distress. She has no wheezes. She has no rales.  Abdominal: Soft. She exhibits no distension. There is no tenderness.  Musculoskeletal: Normal range of motion.  Neurological: She is alert and oriented to person, place, and time.  Skin: Skin is warm and dry.  Psychiatric: She exhibits a depressed mood. She expresses no homicidal and no suicidal ideation.  Tearful    ED Course  Procedures   DIAGNOSTIC STUDIES: Oxygen Saturation is 97% on RA, adequate by my interpretation.    COORDINATION OF CARE: 11:38 PM- Pt advised of plan for treatment and pt agrees.   Psychiatric holding orders in place. TTS consult placed.  TTS has evaluated the patient. Given patient's  alcohol intoxication they feel that it is best allow pt to sober and reevaluate in the morning to assess her safety.    LACERATION REPAIR Performed by: Angus Seller Authorized by: Angus Seller Consent: Verbal consent obtained. Risks and benefits: risks, benefits and alternatives were discussed Consent given by: patient Patient identity confirmed: provided demographic data Prepped and Draped in normal sterile fashion Wound explored  Laceration Location: left lower eyelid  Laceration Length: 0.4 cm  No Foreign Bodies seen or palpated  Anesthesia:none  Irrigation method: syringe Amount of cleaning: standard  Skin closure: dermabond   Patient tolerance: Patient tolerated the procedure well with no immediate complications.  Results for orders placed during the hospital encounter of 05/15/13  CBC      Result Value Ref Range   WBC 4.4  4.0 - 10.5 K/uL   RBC 4.48  3.87 - 5.11 MIL/uL   Hemoglobin 14.4  12.0 - 15.0 g/dL   HCT 09.8  11.9 - 14.7 %   MCV 92.0  78.0 - 100.0 fL   MCH 32.1  26.0 - 34.0 pg   MCHC 35.0  30.0 - 36.0 g/dL   RDW 82.9  56.2 - 13.0 %   Platelets 243  150 - 400 K/uL  COMPREHENSIVE METABOLIC PANEL      Result Value Ref Range   Sodium 144  137 - 147 mEq/L   Potassium 4.2  3.7 - 5.3 mEq/L   Chloride 105  96 - 112 mEq/L   CO2 25  19 - 32 mEq/L   Glucose, Bld 102 (*) 70 - 99 mg/dL   BUN 8  6 - 23 mg/dL   Creatinine, Ser 8.65  0.50 - 1.10 mg/dL   Calcium 9.6  8.4 - 78.4 mg/dL   Total Protein 7.9  6.0 - 8.3 g/dL   Albumin 4.6  3.5 - 5.2 g/dL   AST 23  0 - 37 U/L   ALT 12  0 - 35 U/L   Alkaline Phosphatase 50  39 - 117 U/L   Total Bilirubin 0.2 (*) 0.3 - 1.2 mg/dL   GFR calc non Af Amer >90  >90 mL/min   GFR calc Af Amer >90  >90 mL/min  ETHANOL      Result Value Ref Range   Alcohol, Ethyl (B) 324 (*) 0 - 11 mg/dL  URINE RAPID DRUG SCREEN (HOSP PERFORMED)      Result Value Ref Range   Opiates NONE DETECTED  NONE DETECTED   Cocaine NONE DETECTED   NONE DETECTED   Benzodiazepines POSITIVE (*) NONE DETECTED   Amphetamines NONE DETECTED  NONE DETECTED   Tetrahydrocannabinol NONE DETECTED  NONE DETECTED   Barbiturates NONE DETECTED  NONE DETECTED  POC URINE PREG, ED  Result Value Ref Range   Preg Test, Ur NEGATIVE  NEGATIVE      MDM   Final diagnoses:  Alcohol intoxication  Depressed    I personally performed the services described in this documentation, which was scribed in my presence. The recorded information has been reviewed and is accurate.    Angus Seller, PA-C 05/16/13 [phone]

## 2013-05-15 NOTE — ED Notes (Signed)
Pt very tearful in triage. 

## 2013-05-15 NOTE — ED Notes (Addendum)
Pt and her belongings have been wanded  

## 2013-05-16 DIAGNOSIS — F4321 Adjustment disorder with depressed mood: Secondary | ICD-10-CM

## 2013-05-16 NOTE — ED Provider Notes (Signed)
Medical screening examination/treatment/procedure(s) were performed by non-physician practitioner and as supervising physician I was immediately available for consultation/collaboration.   EKG Interpretation None        Brandt LoosenJulie Dyan Creelman, MD 05/16/13 [phone]

## 2013-05-16 NOTE — Consult Note (Signed)
[facility] Consult   Reason for Consult:  Reportedly suicidal, She says she is not suicidal and never was Referring Physician:  ER MD  Elaine Wallace is an 33 y.o. female. Total Time spent with patient: 45 minutes  Assessment: AXIS I:  Adjustment Disorder with Depressed Mood AXIS II:  Deferred AXIS III:   Past Medical History  Diagnosis Date  . Abnormal Pap smear   . Broken neck 2005  . Anxiety    AXIS IV:  economic problems and educational problems AXIS V:  61-70 mild symptoms  Plan:  No evidence of imminent risk to self or others at present.    Subjective:   Elaine Wallace is a 33 y.o. female patient admitted with reported suicidal thoughts.  HPI:  [name] says she must have been misunderstood as she is not now and never has been suicidal.  She has been upset over her father's having been diagnosed with COPD and still smokes.  A friend's father recently died and she says she is not ready for that.  Said she was drinking yesterday and she took her alprazolam. She fell and cut her eye and came to the ER for help with that.  She has been depressed and anxious and has had an appointment with a new psychiatrist but missed 2 appointments but still plans to see her.  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  Says she does not normally drink that much and is not a regular drinker. HPI Elements:   Location:  depression and anxiety. Quality:  enough toseek help but not suicidal. Severity:  worries excessively. Timing:  father recently diagnosed with COPD. Duration:  several months. Context:  as above.  Past Psychiatric History: Past Medical History  Diagnosis Date  . Abnormal Pap smear   . Broken neck 2005  . Anxiety     reports that she has never smoked. She does not have any smokeless tobacco history on file. She reports that she drinks alcohol. She  reports that she does not use illicit drugs. Family History  Problem Relation Age of Onset  . Depression Mother   . Alcohol abuse Maternal Grandmother   . COPD Maternal Grandmother   . Alcohol abuse Maternal Grandfather   . Diabetes Paternal Grandmother   . Cancer Paternal Grandfather     lung  . COPD Father    Family History Substance Abuse: No Family Supports: Yes, List: (Parents) Living Arrangements: Alone Can pt return to current living arrangement?: Yes Abuse/Neglect [facility]) Physical Abuse: Denies Verbal Abuse: Denies Sexual Abuse: Denies Allergies:   Allergies  Allergen Reactions  . Penicillins Other (See Comments)    Unknown childhood reaction    ACT Assessment Complete:  Yes:    Educational Status    Risk to Self: Risk to self Suicidal Ideation: No Suicidal Intent: No Is patient at risk for suicide?: No Suicidal Plan?: No Access to Means: No What has been your use of drugs/alcohol within the last 12 months?: Denies Previous Attempts/Gestures: No How many times?:  (None noted) Other Self Harm Risks: None noted Triggers for Past Attempts: None known Intentional Self Injurious Behavior: None Family Suicide History: No Recent stressful life event(s): Other (Comment) (father dianosed with COPD) Persecutory voices/beliefs?: No Depression: Yes Depression Symptoms: Tearfulness Substance abuse history and/or treatment for substance abuse?: Yes Suicide prevention information given  to non-admitted patients: Yes  Risk to Others: Risk to Others Homicidal Ideation: No Thoughts of Harm to Others: No Current Homicidal Intent: No Current Homicidal Plan: No Access to Homicidal Means: No Identified Victim: Na History of harm to others?: No Assessment of Violence: None Noted Violent Behavior Description: Na Does patient have access to weapons?: No Criminal Charges Pending?: No Does patient have a court date: No  Abuse: Abuse/Neglect Assessment (Assessment to be  complete while patient is alone) Physical Abuse: Denies Verbal Abuse: Denies Sexual Abuse: Denies Exploitation of patient/patient's resources: Denies Self-Neglect: Denies  Prior Inpatient Therapy: Prior Inpatient Therapy Prior Inpatient Therapy: No Prior Therapy Dates: Na Prior Therapy Facilty/Provider(s): Na Reason for Treatment: Na  Prior Outpatient Therapy: Prior Outpatient Therapy Prior Outpatient Therapy: No Prior Therapy Dates: Na Prior Therapy Facilty/Provider(s): Na Reason for Treatment: Na  Additional Information: Additional Information 1:1 In Past 12 Months?: No CIRT Risk: No Elopement Risk: No Does patient have medical clearance?: Yes                  Objective: Blood pressure 100/65, pulse 69, temperature 98 F (36.7 C), temperature source Oral, resp. rate 18, last menstrual period 05/08/2013, SpO2 99.00%, unknown if currently breastfeeding.There is no weight on file to calculate BMI. Results for orders placed during the hospital encounter of 05/15/13 (from the past 72 hour(s))  CBC     Status: None   Collection Time    05/15/13 10:02 PM      Result Value Ref Range   WBC 4.4  4.0 - 10.5 K/uL   RBC 4.48  3.87 - 5.11 MIL/uL   Hemoglobin 14.4  12.0 - 15.0 g/dL   HCT 41.2  36.0 - 46.0 %   MCV 92.0  78.0 - 100.0 fL   MCH 32.1  26.0 - 34.0 pg   MCHC 35.0  30.0 - 36.0 g/dL   RDW 11.9  11.5 - 15.5 %   Platelets 243  150 - 400 K/uL  COMPREHENSIVE METABOLIC PANEL     Status: Abnormal   Collection Time    05/15/13 10:02 PM      Result Value Ref Range   Sodium 144  137 - 147 mEq/L   Potassium 4.2  3.7 - 5.3 mEq/L   Chloride 105  96 - 112 mEq/L   CO2 25  19 - 32 mEq/L   Glucose, Bld 102 (*) 70 - 99 mg/dL   BUN 8  6 - 23 mg/dL   Creatinine, Ser 0.57  0.50 - 1.10 mg/dL   Calcium 9.6  8.4 - 10.5 mg/dL   Total Protein 7.9  6.0 - 8.3 g/dL   Albumin 4.6  3.5 - 5.2 g/dL   AST 23  0 - 37 U/L   ALT 12  0 - 35 U/L   Alkaline Phosphatase 50  39 - 117 U/L    Total Bilirubin 0.2 (*) 0.3 - 1.2 mg/dL   GFR calc non Af Amer >90  >90 mL/min   GFR calc Af Amer >90  >90 mL/min   Comment: (NOTE)     The eGFR has been calculated using the CKD EPI equation.     This calculation has not been validated in all clinical situations.     eGFR's persistently <90 mL/min signify possible Chronic Kidney     Disease.  ETHANOL     Status: Abnormal   Collection Time    05/15/13 10:02 PM      Result Value Ref Range  Alcohol, Ethyl (B) 324 (*) 0 - 11 mg/dL   Comment:            LOWEST DETECTABLE LIMIT FOR     SERUM ALCOHOL IS 11 mg/dL     FOR MEDICAL PURPOSES ONLY  URINE RAPID DRUG SCREEN (HOSP PERFORMED)     Status: Abnormal   Collection Time    05/15/13 10:16 PM      Result Value Ref Range   Opiates NONE DETECTED  NONE DETECTED   Cocaine NONE DETECTED  NONE DETECTED   Benzodiazepines POSITIVE (*) NONE DETECTED   Amphetamines NONE DETECTED  NONE DETECTED   Tetrahydrocannabinol NONE DETECTED  NONE DETECTED   Barbiturates NONE DETECTED  NONE DETECTED   Comment:            DRUG SCREEN FOR MEDICAL PURPOSES     ONLY.  IF CONFIRMATION IS NEEDED     FOR ANY PURPOSE, NOTIFY LAB     WITHIN 5 DAYS.                LOWEST DETECTABLE LIMITS     FOR URINE DRUG SCREEN     Drug Class       Cutoff (ng/mL)     Amphetamine      1000     Barbiturate      200     Benzodiazepine   829     Tricyclics       562     Opiates          300     Cocaine          300     THC              50  POC URINE PREG, ED     Status: None   Collection Time    05/15/13 10:26 PM      Result Value Ref Range   Preg Test, Ur NEGATIVE  NEGATIVE   Comment:            THE SENSITIVITY OF THIS     METHODOLOGY IS >24 mIU/mL   Labs are reviewed and are pertinent for no psychiatric issues.  Current Facility-Administered Medications  Medication Dose Route Frequency Provider Last Rate Last Dose  . ALPRAZolam Duanne Moron) tablet 0.25 mg  0.25 mg Oral Daily PRN Martie Lee, PA-C   0.25 mg at  05/16/13 0114  . ibuprofen (ADVIL,MOTRIN) tablet 600 mg  600 mg Oral Q8H PRN Ruthell Rummage Dammen, PA-C   600 mg at 05/16/13 0758  . nicotine (NICODERM CQ - dosed in mg/24 hours) patch 21 mg  21 mg Transdermal Daily Ruthell Rummage Dammen, PA-C      . pantoprazole (PROTONIX) EC tablet 80 mg  80 mg Oral Daily Ruthell Rummage Dammen, PA-C   80 mg at 05/16/13 0957  . sucralfate (CARAFATE) tablet 1 g  1 g Oral TID WC & HS Ruthell Rummage Dammen, PA-C   1 g at 05/16/13 0751  . zolpidem (AMBIEN) tablet 5 mg  5 mg Oral QHS PRN Martie Lee, PA-C       Current Outpatient Prescriptions  Medication Sig Dispense Refill  . ALPRAZolam (XANAX) 0.5 MG tablet Take 0.25 mg by mouth daily as needed for anxiety.      Marland Kitchen omeprazole (PRILOSEC) 20 MG capsule Take 40 mg by mouth daily.       . sucralfate (CARAFATE) 1 G tablet Take 1 g by mouth 4 (four) times daily -  with meals and at bedtime.        Psychiatric Specialty Exam:     Blood pressure 100/65, pulse 69, temperature 98 F (36.7 C), temperature source Oral, resp. rate 18, last menstrual period 05/08/2013, SpO2 99.00%, unknown if currently breastfeeding.There is no weight on file to calculate BMI.  General Appearance: Casual  Eye Contact::  Good  Speech:  Clear and Coherent  Volume:  Normal  Mood:  Anxious  Affect:  Appropriate and Congruent  Thought Process:  Coherent and Logical  Orientation:  Full (Time, Place, and Person)  Thought Content:  Negative  Suicidal Thoughts:  No  Homicidal Thoughts:  No  Memory:  Immediate;   Good Recent;   Good Remote;   Good  Judgement:  Good  Insight:  Good  Psychomotor Activity:  Normal  Concentration:  Good  Recall:  Good  Fund of Knowledge:Good  Language: Good  Akathisia:  Negative  Handed:  Right  AIMS (if indicated):     Assets:  Communication Skills Desire for Improvement Financial Resources/Insurance Housing Intimacy Leisure Time Physical Health Social Support Talents/Skills Transportation  Sleep:       Musculoskeletal: Strength & Muscle Tone: within normal limits Gait & Station: normal Patient leans: N/A  Treatment Plan Summary: discharge home today to be followed outpatient by Dr Venancio Poisson D 05/16/2013 9:59 AM

## 2013-05-16 NOTE — BH Assessment (Signed)
Discharge home per Dr. Ladona Ridgelaylor. Patient sts she already has a provider-Dr. Delford FieldWright and will follow up. Writer will however provide patient with a list of additional providers prior to her discharge home.

## 2013-05-16 NOTE — Consult Note (Signed)
  Review of Systems  Constitutional: Negative.   HENT: Negative.   Eyes: Negative.   Respiratory: Negative.   Cardiovascular: Negative.   Gastrointestinal: Negative.   Genitourinary: Negative.   Musculoskeletal: Negative.   Skin: Negative.   Neurological: Negative.   Psychiatric/Behavioral: Positive for depression.   Has a left black eye

## 2013-05-16 NOTE — [facility] Suicide Risk Assessment (Signed)
Suicide Risk Assessment  Discharge Assessment     Demographic Factors:  Caucasian, Low socioeconomic status, Living alone and Unemployed  Total Time spent with patient: 45 minutes  Psychiatric Specialty Exam:     Blood pressure 100/65, pulse 69, temperature 98 F (36.7 C), temperature source Oral, resp. rate 18, last menstrual period 05/08/2013, SpO2 99.00%, unknown if currently breastfeeding.There is no weight on file to calculate BMI.  General Appearance: Casual  Eye Contact::  Good  Speech:  Clear and Coherent  Volume:  Normal  Mood:  Anxious  Affect:  Appropriate and Congruent  Thought Process:  Coherent and Logical  Orientation:  Full (Time, Place, and Person)  Thought Content:  Negative  Suicidal Thoughts:  No  Homicidal Thoughts:  No  Memory:  Immediate;   Good Recent;   Good Remote;   Good  Judgement:  Good  Insight:  Good  Psychomotor Activity:  Normal  Concentration:  Good  Recall:  Good  Fund of Knowledge:Good  Language: Good  Akathisia:  Negative  Handed:  Right  AIMS (if indicated):     Assets:  Communication Skills Desire for Improvement Financial Resources/Insurance Housing Leisure Time Physical Health Social Support Talents/Skills Transportation  Sleep:       Musculoskeletal: Strength & Muscle Tone: within normal limits Gait & Station: normal Patient leans: N/A   Mental Status Per Nursing Assessment::   On Admission:     Current Mental Status by Physician: NA  Loss Factors: NA  Historical Factors: NA  Risk Reduction Factors:   Responsible for children under 31 years of age, Sense of responsibility to family, Religious beliefs about death, Positive social support and Positive coping skills or problem solving skills  Continued Clinical Symptoms:  none  Cognitive Features That Contribute To Risk:  none    Suicide Risk:  Minimal: No identifiable suicidal ideation.  Patients presenting with no risk factors but with morbid  ruminations; may be classified as minimal risk based on the severity of the depressive symptoms  Discharge Diagnoses:   AXIS I:  Adjustment Disorder with Depressed Mood AXIS II:  Deferred AXIS III:   Past Medical History  Diagnosis Date  . Abnormal Pap smear   . Broken neck 2005  . Anxiety    AXIS IV:  economic problems, educational problems and other psychosocial or environmental problems AXIS V:  61-70 mild symptoms  Plan Of Care/Follow-up recommendations:  Activity:  resume usual activity Diet:  resume usual diet  Is patient on multiple antipsychotic therapies at discharge:  No   Has Patient had three or more failed trials of antipsychotic monotherapy by history:  No  Recommended Plan for Multiple Antipsychotic Therapies: NA    TAYLOR,GERALD D 05/16/2013, 10:18 AM
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
## 2013-12-25 ENCOUNTER — Encounter (HOSPITAL_COMMUNITY): Payer: Self-pay | Admitting: Emergency Medicine

## 2015-06-01 ENCOUNTER — Encounter (HOSPITAL_COMMUNITY): Payer: Self-pay | Admitting: Certified Nurse Midwife

## 2015-06-01 ENCOUNTER — Inpatient Hospital Stay (HOSPITAL_COMMUNITY): Payer: Medicaid Other

## 2015-06-01 ENCOUNTER — Inpatient Hospital Stay (HOSPITAL_COMMUNITY)
Admission: AD | Admit: 2015-06-01 | Discharge: 2015-06-01 | Payer: Medicaid Other | Source: Ambulatory Visit | Attending: Obstetrics & Gynecology | Admitting: Obstetrics & Gynecology

## 2015-06-01 DIAGNOSIS — O039 Complete or unspecified spontaneous abortion without complication: Secondary | ICD-10-CM | POA: Diagnosis not present

## 2015-06-01 DIAGNOSIS — Z3A15 15 weeks gestation of pregnancy: Secondary | ICD-10-CM | POA: Diagnosis not present

## 2015-06-01 DIAGNOSIS — F419 Anxiety disorder, unspecified: Secondary | ICD-10-CM | POA: Insufficient documentation

## 2015-06-01 DIAGNOSIS — Z880 Allergy status to penicillin: Secondary | ICD-10-CM | POA: Insufficient documentation

## 2015-06-01 DIAGNOSIS — R112 Nausea with vomiting, unspecified: Secondary | ICD-10-CM | POA: Diagnosis not present

## 2015-06-01 DIAGNOSIS — O26892 Other specified pregnancy related conditions, second trimester: Secondary | ICD-10-CM | POA: Diagnosis present

## 2015-06-01 LAB — URINE MICROSCOPIC-ADD ON

## 2015-06-01 LAB — URINALYSIS, ROUTINE W REFLEX MICROSCOPIC
Bilirubin Urine: NEGATIVE
Glucose, UA: NEGATIVE mg/dL
Ketones, ur: NEGATIVE mg/dL
LEUKOCYTES UA: NEGATIVE
NITRITE: NEGATIVE
Protein, ur: NEGATIVE mg/dL
SPECIFIC GRAVITY, URINE: 1.01 (ref 1.005–1.030)
pH: 5.5 (ref 5.0–8.0)

## 2015-06-01 LAB — WET PREP, GENITAL
Clue Cells Wet Prep HPF POC: NONE SEEN
SPERM: NONE SEEN
TRICH WET PREP: NONE SEEN
YEAST WET PREP: NONE SEEN

## 2015-06-01 LAB — POCT PREGNANCY, URINE: Preg Test, Ur: POSITIVE — AB

## 2015-06-01 MED ORDER — IBUPROFEN 600 MG PO TABS: 600.0000 mg | ORAL_TABLET | Freq: Four times a day (QID) | ORAL | Status: AC | PRN

## 2015-06-01 NOTE — Discharge Instructions (Signed)
Miscarriage  A miscarriage is the sudden loss of an unborn baby (fetus) before the 20th week of pregnancy. Most miscarriages happen in the first 3 months of pregnancy. Sometimes, it happens before a woman even knows she is pregnant. A miscarriage is also called a "spontaneous miscarriage" or "early pregnancy loss." Having a miscarriage can be an emotional experience. Talk with your caregiver about any questions you may have about miscarrying, the grieving process, and your future pregnancy plans.  CAUSES    Problems with the fetal chromosomes that make it impossible for the baby to develop normally. Problems with the baby's genes or chromosomes are most often the result of errors that occur, by chance, as the embryo divides and grows. The problems are not inherited from the parents.   Infection of the cervix or uterus.    Hormone problems.    Problems with the cervix, such as having an incompetent cervix. This is when the tissue in the cervix is not strong enough to hold the pregnancy.    Problems with the uterus, such as an abnormally shaped uterus, uterine fibroids, or congenital abnormalities.    Certain medical conditions.    Smoking, drinking alcohol, or taking illegal drugs.    Trauma.   Often, the cause of a miscarriage is unknown.   SYMPTOMS    Vaginal bleeding or spotting, with or without cramps or pain.   Pain or cramping in the abdomen or lower back.   Passing fluid, tissue, or blood clots from the vagina.  DIAGNOSIS   Your caregiver will perform a physical exam. You may also have an ultrasound to confirm the miscarriage. Blood or urine tests may also be ordered.  TREATMENT    Sometimes, treatment is not necessary if you naturally pass all the fetal tissue that was in the uterus. If some of the fetus or placenta remains in the body (incomplete miscarriage), tissue left behind may become infected and must be removed. Usually, a dilation and curettage (D and C) procedure is performed.  During a D and C procedure, the cervix is widened (dilated) and any remaining fetal or placental tissue is gently removed from the uterus.   Antibiotic medicines are prescribed if there is an infection. Other medicines may be given to reduce the size of the uterus (contract) if there is a lot of bleeding.   If you have Rh negative blood and your baby was Rh positive, you will need a Rh immunoglobulin shot. This shot will protect any future baby from having Rh blood problems in future pregnancies.  HOME CARE INSTRUCTIONS    Your caregiver may order bed rest or may allow you to continue light activity. Resume activity as directed by your caregiver.   Have someone help with home and family responsibilities during this time.    Keep track of the number of sanitary pads you use each day and how soaked (saturated) they are. Write down this information.    Do not use tampons. Do not douche or have sexual intercourse until approved by your caregiver.    Only take over-the-counter or prescription medicines for pain or discomfort as directed by your caregiver.    Do not take aspirin. Aspirin can cause bleeding.    Keep all follow-up appointments with your caregiver.    If you or your partner have problems with grieving, talk to your caregiver or seek counseling to help cope with the pregnancy loss. Allow enough time to grieve before trying to get pregnant again.     SEEK IMMEDIATE MEDICAL CARE IF:    You have severe cramps or pain in your back or abdomen.   You have a fever.   You pass large blood clots (walnut-sized or larger) ortissue from your vagina. Save any tissue for your caregiver to inspect.    Your bleeding increases.    You have a thick, bad-smelling vaginal discharge.   You become lightheaded, weak, or you faint.    You have chills.   MAKE SURE YOU:   Understand these instructions.   Will watch your condition.   Will get help right away if you are not doing well or get worse.     This  information is not intended to replace advice given to you by your health care provider. Make sure you discuss any questions you have with your health care provider.     Document Released: 08/05/2000 Document Revised: 06/06/2012 Document Reviewed: 03/31/2011  Elsevier Interactive Patient Education 2016 [employer]
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
## 2015-06-01 NOTE — MAU Note (Signed)
Pt states that at 0430 she had bright red bleeding when she wiped. Pt states she had painful cramping and at approximately 0530 the fetus was expelled into a urine cup. Pt states she has nausea and vomiting this AM. Officer has the fetus in a red bio hazard bag.

## 2015-06-03 LAB — GC/CHLAMYDIA PROBE AMP (~~LOC~~) NOT AT ARMC
CHLAMYDIA, DNA PROBE: NEGATIVE
Neisseria Gonorrhea: NEGATIVE

## 2015-06-07 DIAGNOSIS — O039 Complete or unspecified spontaneous abortion without complication: Secondary | ICD-10-CM | POA: Diagnosis present

## 2015-06-12 ENCOUNTER — Encounter: Payer: Self-pay | Admitting: Obstetrics & Gynecology

## 2015-07-01 ENCOUNTER — Encounter: Payer: Self-pay | Admitting: Obstetrics & Gynecology

## 2016-04-05 ENCOUNTER — Encounter (HOSPITAL_COMMUNITY): Payer: Self-pay

## 2017-12-20 ENCOUNTER — Encounter: Payer: Self-pay | Admitting: *Deleted

## 2018-02-23 DIAGNOSIS — B029 Zoster without complications: Secondary | ICD-10-CM

## 2018-02-23 HISTORY — DX: Zoster without complications: B02.9

## 2019-02-24 DIAGNOSIS — F1920 Other psychoactive substance dependence, uncomplicated: Secondary | ICD-10-CM

## 2019-02-24 HISTORY — DX: Other psychoactive substance dependence, uncomplicated: F19.20

## 2019-02-24 NOTE — L&D Delivery Note (Signed)
Nurse calls out requesting provider assistance.  Provider to bedside and patient pushing. Fetal head noted and brown MSF noted from vagina. Table obtained and provider gowned for delivery. Nurses instructed to notify NICU team and reports they are en route. Patient delivered, as below, with provider and staff assistance.  FHR remained reassuring throughout pushing.    Delivery Note At 9:13 PM, on Dec 08, 2019, a viable female was delivered via Vaginal, Spontaneous (Presentation:Left Occiput Anterior).  After delivery of head, nuchal cord noted that shoulders and body was delivered through via somersault maneuver. Cord noted around body and removed without difficulty.  Infant with good tone, but no grimace and deep bulb suction given of mouth and nose by provider prior to stimulation.  After removal of significant secretions, tactile stimulation given and baby with minimal grimace.  HR >120 by cord pulsation count. Infant 1 minute  APGAR: 6.  After 1 minute delay, the umbilical cord was clamped, cut by provider, and infant handed off, by provider, to NICU team at ~2 minutes of life. IM pitocin ordered per protocol and administered in left glute. Placenta delivered spontaneously via Tomasa Blase and was noted to be intact with 3VC upon inspection. Cord blood collected by provider.  Vaginal inspection revealed a hemostatic left labial laceration that was not repaired. Patient also had a right labial abrasion that was hemostatic with pressure.  Fundus firm, at the umbilicus, and bleeding small.  Mother hemodynamically stable.  Infant to NICU.  Mother desires "whatever you want to give" for birth control.  Discussed possible depo provera injection prior to discharge. Infant weight at one hour of life: 6lbs 4.2 oz, 20 in   Anesthesia: None  Episiotomy: None Lacerations: Left Labial Laceration-Hemostatic Suture Repair: None Est. Blood Loss (mL): 150    Mom to postpartum.  Baby to Couplet care / Skin to  Skin.  Cherre Robins, MSN, CNM 12/08/2019, 9:48 PM

## 2019-11-09 ENCOUNTER — Other Ambulatory Visit: Payer: Self-pay

## 2019-11-09 ENCOUNTER — Inpatient Hospital Stay (HOSPITAL_COMMUNITY)
Admission: EM | Admit: 2019-11-09 | Discharge: 2019-11-09 | Disposition: A | Discharge status: Home / Self Care | Payer: Medicaid Other | Attending: Obstetrics & Gynecology | Admitting: Obstetrics & Gynecology

## 2019-11-09 ENCOUNTER — Telehealth: Payer: Self-pay | Admitting: Obstetrics and Gynecology

## 2019-11-09 ENCOUNTER — Encounter (HOSPITAL_COMMUNITY): Payer: Self-pay | Admitting: *Deleted

## 2019-11-09 ENCOUNTER — Inpatient Hospital Stay (HOSPITAL_BASED_OUTPATIENT_CLINIC_OR_DEPARTMENT_OTHER): Payer: Medicaid Other

## 2019-11-09 DIAGNOSIS — Z3A33 33 weeks gestation of pregnancy: Secondary | ICD-10-CM

## 2019-11-09 DIAGNOSIS — O99323 Drug use complicating pregnancy, third trimester: Secondary | ICD-10-CM | POA: Diagnosis not present

## 2019-11-09 DIAGNOSIS — O26893 Other specified pregnancy related conditions, third trimester: Secondary | ICD-10-CM | POA: Diagnosis present

## 2019-11-09 DIAGNOSIS — F1193 Opioid use, unspecified with withdrawal: Secondary | ICD-10-CM

## 2019-11-09 DIAGNOSIS — O99891 Other specified diseases and conditions complicating pregnancy: Secondary | ICD-10-CM

## 2019-11-09 DIAGNOSIS — O99333 Smoking (tobacco) complicating pregnancy, third trimester: Secondary | ICD-10-CM

## 2019-11-09 DIAGNOSIS — Z3687 Encounter for antenatal screening for uncertain dates: Secondary | ICD-10-CM

## 2019-11-09 DIAGNOSIS — O09523 Supervision of elderly multigravida, third trimester: Secondary | ICD-10-CM

## 2019-11-09 DIAGNOSIS — O0933 Supervision of pregnancy with insufficient antenatal care, third trimester: Secondary | ICD-10-CM | POA: Diagnosis not present

## 2019-11-09 DIAGNOSIS — F17200 Nicotine dependence, unspecified, uncomplicated: Secondary | ICD-10-CM

## 2019-11-09 DIAGNOSIS — F1120 Opioid dependence, uncomplicated: Secondary | ICD-10-CM

## 2019-11-09 DIAGNOSIS — Z3490 Encounter for supervision of normal pregnancy, unspecified, unspecified trimester: Secondary | ICD-10-CM

## 2019-11-09 DIAGNOSIS — F1123 Opioid dependence with withdrawal: Secondary | ICD-10-CM | POA: Insufficient documentation

## 2019-11-09 DIAGNOSIS — R109 Unspecified abdominal pain: Secondary | ICD-10-CM

## 2019-11-09 HISTORY — DX: Herpesviral infection, unspecified: B00.9

## 2019-11-09 HISTORY — DX: Unspecified abnormal cytological findings in specimens from vagina: R87.629

## 2019-11-09 HISTORY — DX: Carpal tunnel syndrome, unspecified upper limb: G56.00

## 2019-11-09 LAB — COMPREHENSIVE METABOLIC PANEL
ALT: 13 U/L (ref 0–44)
AST: 24 U/L (ref 15–41)
Albumin: 2.8 g/dL — ABNORMAL LOW (ref 3.5–5.0)
Alkaline Phosphatase: 173 U/L — ABNORMAL HIGH (ref 38–126)
Anion gap: 9 (ref 5–15)
BUN: 7 mg/dL (ref 6–20)
CO2: 21 mmol/L — ABNORMAL LOW (ref 22–32)
Calcium: 8.4 mg/dL — ABNORMAL LOW (ref 8.9–10.3)
Chloride: 105 mmol/L (ref 98–111)
Creatinine, Ser: 0.5 mg/dL (ref 0.44–1.00)
GFR calc Af Amer: >60 mL/min (ref >60)
GFR calc non Af Amer: >60 mL/min (ref >60)
Glucose, Bld: 86 mg/dL (ref 70–99)
Potassium: 3 mmol/L — ABNORMAL LOW (ref 3.5–5.1)
Sodium: 135 mmol/L (ref 135–145)
Total Bilirubin: 0.7 mg/dL (ref 0.3–1.2)
Total Protein: 6 g/dL — ABNORMAL LOW (ref 6.5–8.1)

## 2019-11-09 LAB — URINALYSIS, ROUTINE W REFLEX MICROSCOPIC
Bilirubin Urine: NEGATIVE
Glucose, UA: NEGATIVE mg/dL
Hgb urine dipstick: NEGATIVE
Ketones, ur: 20 mg/dL — AB
Nitrite: NEGATIVE
Protein, ur: NEGATIVE mg/dL
Specific Gravity, Urine: 1.011 (ref 1.005–1.030)
pH: 9 — ABNORMAL HIGH (ref 5.0–8.0)

## 2019-11-09 LAB — HEPATITIS C ANTIBODY: HCV Ab: NONREACTIVE

## 2019-11-09 LAB — HIV ANTIBODY (ROUTINE TESTING W REFLEX): HIV Screen 4th Generation wRfx: NONREACTIVE

## 2019-11-09 LAB — WET PREP, GENITAL
Sperm: NONE SEEN
Yeast Wet Prep HPF POC: NONE SEEN

## 2019-11-09 LAB — AMNISURE RUPTURE OF MEMBRANE (ROM) NOT AT ARMC: Amnisure ROM: NEGATIVE

## 2019-11-09 LAB — CBC
HCT: 34.9 % — ABNORMAL LOW (ref 36.0–46.0)
Hemoglobin: 11.4 g/dL — ABNORMAL LOW (ref 12.0–15.0)
MCH: 30.6 pg (ref 26.0–34.0)
MCHC: 32.7 g/dL (ref 30.0–36.0)
MCV: 93.8 fL (ref 80.0–100.0)
Platelets: 263 10*3/uL (ref 150–400)
RBC: 3.72 MIL/uL — ABNORMAL LOW (ref 3.87–5.11)
RDW: 12.7 % (ref 11.5–15.5)
WBC: 6.7 10*3/uL (ref 4.0–10.5)
nRBC: 0 % (ref 0.0–0.2)

## 2019-11-09 LAB — HEMOGLOBIN A1C
Hgb A1c MFr Bld: 5.1 % (ref 4.8–5.6)
Mean Plasma Glucose: 99.67 mg/dL

## 2019-11-09 LAB — HEPATITIS B SURFACE ANTIGEN: Hepatitis B Surface Ag: NONREACTIVE

## 2019-11-09 MED ORDER — SODIUM CHLORIDE 0.9 % IV BOLUS
250.0000 mL | Freq: Once | INTRAVENOUS | Status: AC
  Administered 2019-11-09: 250 mL via INTRAVENOUS

## 2019-11-09 MED ORDER — HYDROXYZINE HCL 50 MG/ML IM SOLN
50.0000 mg | Freq: Four times a day (QID) | INTRAMUSCULAR | Status: DC | PRN
  Administered 2019-11-09: 50 mg via INTRAMUSCULAR
  Filled 2019-11-09 (×2): qty 1

## 2019-11-09 NOTE — MAU Note (Signed)
Pt arrived via [facility] from [facility].  No bleeding or leaking. 1 ctx during transfer per [facility].   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 No care during the contraction.

## 2019-11-09 NOTE — ED Triage Notes (Signed)
Pt is pregnant, does not know how far along she is and has not been seeing an OB/GYN for her pregnancy, this is her third pregnancy.  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

## 2019-11-09 NOTE — ED Notes (Signed)
Have spoken with [facility]. Pt is currently hooked up to heart monitor, vital signs machine, and OB monitor.

## 2019-11-09 NOTE — ED Triage Notes (Signed)
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  Denies SI or HI

## 2019-11-09 NOTE — ED Notes (Signed)
Signature pad in room broken, pt gives verbal authorization and understands she is being transferred to [facility] hospital for further treatment; pt's mother told to go to [facility] to await arrival of pt

## 2019-11-09 NOTE — ED Provider Notes (Signed)
[facility] EMERGENCY DEPARTMENT Provider Note   CSN: [id number] Arrival date & time: 11/09/19  1220     History Chief Complaint  Patient presents with  . Labor Eval    Elaine Wallace is a 39 y.o. female.  HPI 39 year old female with a history of anxiety currently 7 or 8 months pregnant (patient states she is not sure as her last menstrual cycle was probably in January or February, has not sought any prenatal care leading up to this point) presents to the ER with complaints of heroin withdrawal and domino cramping.  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  States she snorts heroin.  Endorses small gush of vaginal fluid, no nausea or vomiting.  Endorses abdominal cramping, not sure if this is from withdrawal or due to the pregnancy.  She has felt the baby move.  Patient endorses 2 prior pregnancies to this one. Endorses body aches, runny nose, not vaccinated. States this feels like her withdrawal.     Past Medical History:  Diagnosis Date  . Abnormal Pap smear   . Anxiety   . Broken neck (HCC) 2005    Patient Active Problem List   Diagnosis Date Noted  . Complete abortion 06/07/2015    Past Surgical History:  Procedure Laterality Date  . NO PAST SURGERIES       OB History    Gravida  3   Para  2   Term  2   Preterm      AB      Living  2     SAB      TAB      Ectopic      Multiple      Live Births  2           Family History  Problem Relation Age of Onset  . Depression Mother   . Alcohol abuse Maternal Grandmother   . COPD Maternal Grandmother   . Alcohol abuse Maternal Grandfather   . Diabetes Paternal Grandmother   . Cancer Paternal Grandfather        lung  . COPD Father     Social History   Tobacco Use  . Smoking status: Current Every Day Smoker  . Smokeless tobacco: Never Used  Substance Use Topics  . Alcohol use: Not Currently  . Drug use: Yes    Comment: heroin     Home  Medications Prior to Admission medications   Medication Sig Start Date End Date Taking? Authorizing Provider  acetaminophen (TYLENOL) 325 MG tablet Take 650 mg by mouth every 6 (six) hours as needed for mild pain or headache.    [provider]  ibuprofen (ADVIL,MOTRIN) 600 MG tablet Take 1 tablet (600 mg total) by mouth every 6 (six) hours as needed. 06/01/15   Aviva Signs, CNM    Allergies    Penicillins  Review of Systems   Review of Systems  Constitutional: Positive for chills. Negative for fever.  HENT: Positive for rhinorrhea. Negative for ear pain and sore throat.   Eyes: Negative for pain and visual disturbance.  Respiratory: Negative for cough and shortness of breath.   Cardiovascular: Negative for chest pain and palpitations.  Gastrointestinal: Positive for abdominal pain. Negative for vomiting.  Genitourinary: Positive for pelvic pain. Negative for dysuria and hematuria.  Musculoskeletal: Negative for arthralgias and back pain.  Skin: Negative for color change and rash.  Neurological: Negative for seizures and syncope.  All other  systems reviewed and are negative.   Physical Exam Updated Vital Signs BP (!) 122/99 (BP Location: Right Arm)   Pulse 94   Temp 97.8 F (36.6 C) (Oral)   Resp 20   Ht 5\' 4"  (1.626 m)   Wt 56.2 kg   SpO2 100%   BMI 21.25 kg/m   Physical Exam Vitals and nursing note reviewed.  Constitutional:      General: She is not in acute distress.    Appearance: She is well-developed. She is not ill-appearing or diaphoretic.     Comments: Anxious appearing   HENT:     Head: Normocephalic and atraumatic.  Eyes:     Extraocular Movements: Extraocular movements intact.     Conjunctiva/sclera: Conjunctivae normal.     Pupils: Pupils are equal, round, and reactive to light.     Comments: Left conjunctival erythema.   Cardiovascular:     Rate and Rhythm: Normal rate and regular rhythm.     Heart sounds: No murmur heard.   Pulmonary:      Effort: Pulmonary effort is normal. No respiratory distress.     Breath sounds: Normal breath sounds.  Abdominal:     Palpations: Abdomen is soft.     Tenderness: There is no abdominal tenderness.     Comments: Abdomen full, distended, consistent with pregnancy, nonteder- fetal heart tones 152   Genitourinary:    Comments: Cervical exam performed by Dr. ;  about 75% effaced, fingertip cervix opening, head is down, at -2 station.  No blood on examining fingers. Musculoskeletal:        General: Normal range of motion.     Cervical back: Normal range of motion and neck supple.  Skin:    General: Skin is warm and dry.  Neurological:     General: No focal deficit present.     Mental Status: She is alert.  Psychiatric:        Mood and Affect: Mood normal.        Behavior: Behavior normal.     ED Results / Procedures / Treatments   Labs (all labs ordered are listed, but only abnormal results are displayed) Labs Reviewed  CBC - Abnormal; Notable for the following components:      Result Value   RBC 3.72 (*)    Hemoglobin 11.4 (*)    HCT 34.9 (*)    All other components within normal limits  URINALYSIS, ROUTINE W REFLEX MICROSCOPIC  COMPREHENSIVE METABOLIC PANEL    EKG None  Radiology No results found.  Procedures Procedures (including critical care time) CRITICAL CARE Performed by: Effie Shy   Total critical care time: 32  minutes  Critical care time was exclusive of separately billable procedures and treating other patients.  Critical care was necessary to treat or prevent imminent or life-threatening deterioration.  Critical care was time spent personally by me on the following activities: development of treatment plan with patient and/or surrogate as well as nursing, discussions with consultants, evaluation of patient's response to treatment, examination of patient, obtaining history from patient or surrogate, ordering and performing treatments and  interventions, ordering and review of laboratory studies, ordering and review of radiographic studies, pulse oximetry and re-evaluation of patient's condition.  Medications Ordered in ED Medications  sodium chloride 0.9 % bolus 250 mL (has no administration in time range)    ED Course  I have reviewed the triage vital signs and the nursing notes.  Pertinent labs & imaging results that were available  during my care of the patient were reviewed by me and considered in my medical decision making (see chart for details).    MDM Rules/Calculators/A&P                         39 year old G3P2 female approximately 7 or 8 months pregnant per patient report (no prior prenatal care) presents to the ER with complaints of heroin withdrawal and abdominal cramping.  Vitals overall reassuring.  Cervical exam performed by Dr. Effie Shy, about 75% effaced, fingertip cervix opening, head is down, at -2 station.  No blood on exam.  Fetal heart tones in the 150s.  CMP, CBC, UA pending.  Rapid response was called, per record their recommendation, they would like the patient to be transfer to MAU.  Accepting provider Dr. Debroah Loop, will send via carelink. Hemodynamically stable at this time.   Final Clinical Impression(s) / ED Diagnoses Final diagnoses:  Heroin withdrawal (HCC)  Abdominal cramping affecting pregnancy    Rx / DC Orders ED Discharge Orders    None       Leone Brand 11/09/19 1341    Mancel Bale, MD 11/09/19 1818

## 2019-11-09 NOTE — ED Provider Notes (Signed)
  Face-to-face evaluation   History: Patient is here with concerns of ongoing heroin use, and pregnancy without prenatal care.  Her last menstrual period was sometime in January 2021.  She denies vaginal bleeding, drainage of fluid, or crampy abdominal pain.  She has mild diffuse abdominal pain.  Physical exam: Alert, calm, cooperative.  Abdomen is distended, it is soft, there are no areas of tenderness or palpable uterine cramping.  Patient is lucid but anxious.  Sterile cervix exam by me, about 75% effaced, fingertip cervix opening, head is down, at -2 station.  No blood on examining fingers.  Medical screening examination/treatment/procedure(s) were conducted as a shared visit with non-physician practitioner(s) and myself.  I personally evaluated the patient during the encounter    Mancel Bale, MD 11/09/19 1819

## 2019-11-09 NOTE — ED Notes (Signed)
Called [facility] to transfer Pt to [facility]

## 2019-11-09 NOTE — Progress Notes (Signed)
[facility] notified that pt arrived to [facility] ED reporting abdominal pain which she says "makes it hard to walk," and also wishes to detox from heroin which she reports using approx 10-12hrs ago.  Pt has received no prenatal care but says she is G3P2 and believes she had a period back in January.  ED doctor performs sve and calls cervix ft/-2.  He also says no bleeding was noted on exam.  Dr Debroah Loop called and notified of this.  Orders to transfer her to MAU via carelink.  Jeani Hawking made aware.

## 2019-11-09 NOTE — ED Notes (Signed)
Dr. Scheryl Darter Transfer to MAU

## 2019-11-09 NOTE — Progress Notes (Signed)
Deetta Perla, RN, [facility] notified regarding pt leaving AMA with peripheral IV intact.

## 2019-11-09 NOTE — Progress Notes (Signed)
CSW consulted due to pt having substances use while pregnant. CSW spoke with pt at bedside to offer further  Support. CSW introduced role to pt and advised pt of the reason for CSW coming to speak with her. Pt expressed that she is addicted to Opiates and Heroin. Pt expressed that she became addicted due to "my carpal tunnel". Pt expressed that she  was diagnosed with carpal tunnel three years ago and was given Opiates to help manage the pain. Per pt, "I also was recovering from my miscarriage from last year and I lost track of how far along I was". CSW and RN noted that Pt's miscarriage is not from last year, however is from 2017. CSW asked pt how often she uses substances and pt expressed "ive been snorting it mostly dan that was last done about 15 hours ago. I really wanna get off these drugs". Pt was unable to tell CSW how often she uses as pt apeaerd to be in pain and withdrawing per pt's report. Pt advised CSW that she has never been in substance use treatment before and then advised CSW that she was in treatment for ETOH in the past, but nothing else. CSW offered pt substance use resources in which pt was agreeable to. CSW encouraged pt to reach out to facilities as facilities request that pt reaches out in most cases to get further services established. Pt expressed that she understood and expressed that "baby is kicking really hard because I am withdrawing".   CSW asked pt about other children in which pt reported that they are with their father at this time. CSW didn't press this as pt expressed "I am more worried about this baby now". CSW validated pt's concerns and advised pt that CSW would provide her with resources as requested for Methadone clinics and other treatment options.   During the time that CSW spoke with pt, Pt appeared to be restless but able to answer questions that CSW asked.    Elaine Wallace, MSW, LCSW [facility] [place] [phone]

## 2019-11-09 NOTE — Telephone Encounter (Signed)
Patient unable to be reached at the phone number listed.

## 2019-11-09 NOTE — MAU Note (Signed)
Pt came out of room, dressed.  Stated "I am not in labor, and I am ready to leave, I am hungry".  Asked pt if she was still wanting to help, pt again stated " I am ready to leave".  Said to pt we need to remove your IV, pt stated it is gone, they took it out, again said to pt that it was still there, she said, no and I am leaving, preceded to walk done the hall.

## 2019-11-09 NOTE — MAU Provider Note (Signed)
History     CSN: [id number]  Arrival date and time: 11/09/19 1220   First Provider Initiated Contact with Patient 11/09/19 1552      Chief Complaint  Patient presents with  . Labor Eval  . Abdominal Pain   Elaine Wallace is a 39 y.o. [phone] at Unknown [place] who has not established PNC.  She presents today, via transfer, after evaluation at [facility] for Abdominal Pain and Request with Detox Assistance.  Patient is unsure of when her pain started and is mildly incoherent regarding questions as she continues to report pain and discomfort.  Patient states that her "skin is crawling and my stomach hurts so bad."  Patient insistently requests that provider helps her with her pain.  Patient able to report that her last menstrual period was in January or Feburary.  Patient also able to report that she last used Heroin ~ 15 hours ago.   Upon provider arrival in room patient reports that "I peed all over myself and my socks are wet."    OB History    Gravida  4   Para  2   Term  2   Preterm      AB  1   Living  2     SAB  1   TAB      Ectopic      Multiple      Live Births  2           Past Medical History:  Diagnosis Date  . Abnormal Pap smear   . Anxiety   . Broken neck (HCC) 2005  . Carpal tunnel syndrome    reports needed surgery, was taking pain pills and got addicted to them  . Drug addiction (HCC) 2021    Past Surgical History:  Procedure Laterality Date  . NO PAST SURGERIES      Family History  Problem Relation Age of Onset  . Depression Mother   . Alcohol abuse Maternal Grandmother   . COPD Maternal Grandmother   . Alcohol abuse Maternal Grandfather   . Diabetes Paternal Grandmother   . Cancer Paternal Grandfather        lung  . COPD Father     Social History   Tobacco Use  . Smoking status: Current Some Day Smoker  . Smokeless tobacco: Never Used  Vaping Use  . Vaping Use: Never used  Substance Use Topics  . Alcohol use: Not  Currently  . Drug use: Yes    Frequency: 14.0 times per week    Comment: heroin     Allergies:  Allergies  Allergen Reactions  . Penicillins Other (See Comments)    Unknown childhood reaction    Medications Prior to Admission  Medication Sig Dispense Refill Last Dose  . ALPRAZolam (XANAX) 0.5 MG tablet Take 0.5 mg by mouth at bedtime as needed for anxiety.   Past Week at Unknown time  . sertraline (ZOLOFT) 50 MG tablet Take 50 mg by mouth daily.   Past Week at Unknown time  . acetaminophen (TYLENOL) 325 MG tablet Take 650 mg by mouth every 6 (six) hours as needed for mild pain or headache.     . ibuprofen (ADVIL,MOTRIN) 600 MG tablet Take 1 tablet (600 mg total) by mouth every 6 (six) hours as needed. 30 tablet 0     Review of Systems  Gastrointestinal: Positive for abdominal pain.  Genitourinary: Negative for difficulty urinating, dysuria, vaginal bleeding and vaginal discharge.  Skin:       "  Crawling"   Physical Exam   Blood pressure 118/81, pulse (!) 57, temperature 98.5 F (36.9 C), temperature source Oral, resp. rate 16, height 5\' 4"  (1.626 m), weight 56.2 kg, SpO2 100 %, unknown if currently breastfeeding.  Physical Exam Constitutional:      Appearance: She is well-developed.  HENT:     Head: Normocephalic and atraumatic.  Cardiovascular:     Rate and Rhythm: Normal rate and regular rhythm.     Heart sounds: Normal heart sounds.  Pulmonary:     Effort: Pulmonary effort is normal. No respiratory distress.     Breath sounds: Normal breath sounds.  Abdominal:     Palpations: Abdomen is soft.     Tenderness: There is no abdominal tenderness.     Comments: Gravid.  Soft, NT, No Ctx Palpated. FH ~3FB Above Umbilicus  Neurological:     Mental Status: She is alert.    Dilation: 1 Effacement (%): 50 Cervical Position: Posterior Exam by:: Toretto Tingler CNM  Fetal Assessment 135 bpm, Mod Var, -Decels, +Accels Toco: One contraction graphed, irritability noted  MAU Course    Results for orders placed or performed during the hospital encounter of 11/09/19 (from the past 24 hour(s))  Urinalysis, Routine w reflex microscopic     Status: Abnormal   Collection Time: 11/09/19  1:01 PM  Result Value Ref Range   Color, Urine YELLOW YELLOW   APPearance CLEAR CLEAR   Specific Gravity, Urine 1.011 1.005 - 1.030   pH 9.0 (H) 5.0 - 8.0   Glucose, UA NEGATIVE NEGATIVE mg/dL   Hgb urine dipstick NEGATIVE NEGATIVE   Bilirubin Urine NEGATIVE NEGATIVE   Ketones, ur 20 (A) NEGATIVE mg/dL   Protein, ur NEGATIVE NEGATIVE mg/dL   Nitrite NEGATIVE NEGATIVE   Leukocytes,Ua LARGE (A) NEGATIVE   RBC / HPF 0-5 0 - 5 RBC/hpf   WBC, UA 6-10 0 - 5 WBC/hpf   Bacteria, UA RARE (A) NONE SEEN   Squamous Epithelial / LPF 0-5 0 - 5  CBC     Status: Abnormal   Collection Time: 11/09/19  1:30 PM  Result Value Ref Range   WBC 6.7 4.0 - 10.5 K/uL   RBC 3.72 (L) 3.87 - 5.11 MIL/uL   Hemoglobin 11.4 (L) 12.0 - 15.0 g/dL   HCT 11/11/19 (L) 36 - 46 %   MCV 93.8 80.0 - 100.0 fL   MCH 30.6 26.0 - 34.0 pg   MCHC 32.7 30.0 - 36.0 g/dL   RDW 75.1 02.5 - 85.2 %   Platelets 263 150 - 400 K/uL   nRBC 0.0 0.0 - 0.2 %  Comprehensive metabolic panel     Status: Abnormal   Collection Time: 11/09/19  1:30 PM  Result Value Ref Range   Sodium 135 135 - 145 mmol/L   Potassium 3.0 (L) 3.5 - 5.1 mmol/L   Chloride 105 98 - 111 mmol/L   CO2 21 (L) 22 - 32 mmol/L   Glucose, Bld 86 70 - 99 mg/dL   BUN 7 6 - 20 mg/dL   Creatinine, Ser 11/11/19 0.44 - 1.00 mg/dL   Calcium 8.4 (L) 8.9 - 10.3 mg/dL   Total Protein 6.0 (L) 6.5 - 8.1 g/dL   Albumin 2.8 (L) 3.5 - 5.0 g/dL   AST 24 15 - 41 U/L   ALT 13 0 - 44 U/L   Alkaline Phosphatase 173 (H) 38 - 126 U/L   Total Bilirubin 0.7 0.3 - 1.2 mg/dL   GFR calc non Af  Amer >60 >60 mL/min   GFR calc Af Amer >60 >60 mL/min   Anion gap 9 5 - 15  Wet prep, genital     Status: Abnormal   Collection Time: 11/09/19  4:00 PM   Specimen: Vaginal  Result Value Ref Range    Yeast Wet Prep HPF POC NONE SEEN NONE SEEN   Trich, Wet Prep PRESENT (A) NONE SEEN   Clue Cells Wet Prep HPF POC PRESENT (A) NONE SEEN   WBC, Wet Prep HPF POC MANY (A) NONE SEEN   Sperm NONE SEEN   HIV Antibody (routine testing w rflx)     Status: None   Collection Time: 11/09/19  4:44 PM  Result Value Ref Range   HIV Screen 4th Generation wRfx Non Reactive Non Reactive  Hepatitis B surface antigen     Status: None   Collection Time: 11/09/19  4:44 PM  Result Value Ref Range   Hepatitis B Surface Ag NON REACTIVE NON REACTIVE  Hepatitis C antibody     Status: None   Collection Time: 11/09/19  4:44 PM  Result Value Ref Range   HCV Ab NON REACTIVE NON REACTIVE  Hemoglobin A1c     Status: None   Collection Time: 11/09/19  4:45 PM  Result Value Ref Range   Hgb A1c MFr Bld 5.1 4.8 - 5.6 %   Mean Plasma Glucose 99.67 mg/dL  Amnisure [facility] membrane (rom)[facility] at [facility]     Status: None   Collection Time: 11/09/19  5:05 PM  Result Value Ref Range   Amnisure ROM NEGATIVE    No results found.  MDM Cervical Exam with Wet Prep, GC/CT Labs: PN Labs EFM SW Consult MD Consult Assessment and Plan  39 year old [id number]  IUP at Unknown [place] Cat I FT Abdominal Pain Heroin Use Desires Detox  -Reassurances given. -Informed that exam today would not focus on detox, but on limited prenatal care that would collect labwork, limited US to determine GA, and STD/vaginal infection testing.   -Patient questions if she will learn gender of infant and informed that she will not.  Focus is not on gender, but on dating. -Blind swab to collect GC/CT and Wet Prep -Cervical exam performed and findings discussed. -US ordered. -Dr. Marcie BalJ. Arnold updated on patient status and recommendation for IM Vistaril for complaints of pain and discomfort. Agrees -Vistaril 50mg  IM ordered. -NST Reactive. -Will await results. -SW reports that resources will be given, but up to patient to take initiative.    Cherre RobinsJessica L  Rubie Ficco MSN, CNM 11/09/2019, 3:52 PM   Reassessment (5:08 PM) [place]  -Preliminary US returns with GA at 33 weeks and AFI at <3%ile. -Dr. Wilhelmenia BlaseV. Fang called and provider requests final reading of US with recommendation for coordination of care. Reviewed US and advised: *Admit for observation *IV Hydration *Steroid Dosing *Complete US in AM for Growth. -Provider recommends Amnisure as well and Dr. Parke PoissonFang agreeable.  -Thanks given to Dr. Parke PoissonFang for his input and collaboration regarding patient's care. -Amnisure ordered and collected. -Patient resting in bed, reports pain has improved with Vistaril dosing. Mother now at bedside.   Reassessment (6:40 PM) -Amnisure returns negative. -Dr. Sherryl MangesJ. Ferguson consulted and informed of patient status, results, and MFM recommendation. *Agrees with plan for admission and will come to discuss and admit patient accordingly.  Reassessment (6:48 PM) -Nurse reports patient left AMA stating "I am not in labor and so I am leaving" to the phlebotomist.  Reports that IV was  still in place upon exit.  -Dr. Sherryl Manges contacted and advised that [facility] be contacted since patient left with IV still in place. -Charge nurse notified of MD order.  Cherre Robins MSN, CNM Advanced Practice Provider, [facility] [employer]

## 2019-11-09 NOTE — MAU Note (Signed)
Linens changed while pt in [place], large wet spot noted, pt peed the bed.  SW came by and brought info on Methadone/Suboxone clinic and outpt resources. Pt to call and seek care.

## 2019-11-09 NOTE — ED Notes (Signed)
[facility] given report.  

## 2019-11-10 LAB — GC/CHLAMYDIA PROBE AMP (~~LOC~~) NOT AT ARMC
Chlamydia: NEGATIVE
Comment: NEGATIVE
Comment: NORMAL
Neisseria Gonorrhea: NEGATIVE

## 2019-11-10 LAB — RPR: RPR Ser Ql: NONREACTIVE

## 2019-11-14 ENCOUNTER — Encounter (HOSPITAL_COMMUNITY): Payer: Self-pay | Admitting: Obstetrics & Gynecology

## 2019-11-14 ENCOUNTER — Inpatient Hospital Stay (HOSPITAL_COMMUNITY)
Admission: EM | Admit: 2019-11-14 | Discharge: 2019-11-15 | DRG: 833 | Discharge status: Against Medical Advice | Payer: Medicaid Other | Attending: Obstetrics & Gynecology | Admitting: Obstetrics & Gynecology

## 2019-11-14 ENCOUNTER — Inpatient Hospital Stay (HOSPITAL_BASED_OUTPATIENT_CLINIC_OR_DEPARTMENT_OTHER): Payer: Medicaid Other

## 2019-11-14 ENCOUNTER — Other Ambulatory Visit: Payer: Self-pay

## 2019-11-14 DIAGNOSIS — O99333 Smoking (tobacco) complicating pregnancy, third trimester: Secondary | ICD-10-CM | POA: Diagnosis present

## 2019-11-14 DIAGNOSIS — Z3A33 33 weeks gestation of pregnancy: Secondary | ICD-10-CM | POA: Diagnosis not present

## 2019-11-14 DIAGNOSIS — F11929 Opioid use, unspecified with intoxication, unspecified: Secondary | ICD-10-CM

## 2019-11-14 DIAGNOSIS — R4182 Altered mental status, unspecified: Secondary | ICD-10-CM | POA: Diagnosis present

## 2019-11-14 DIAGNOSIS — Z20822 Contact with and (suspected) exposure to covid-19: Secondary | ICD-10-CM | POA: Diagnosis present

## 2019-11-14 DIAGNOSIS — F17200 Nicotine dependence, unspecified, uncomplicated: Secondary | ICD-10-CM | POA: Diagnosis present

## 2019-11-14 DIAGNOSIS — O99323 Drug use complicating pregnancy, third trimester: Secondary | ICD-10-CM | POA: Diagnosis present

## 2019-11-14 DIAGNOSIS — O99343 Other mental disorders complicating pregnancy, third trimester: Secondary | ICD-10-CM | POA: Diagnosis present

## 2019-11-14 DIAGNOSIS — O0933 Supervision of pregnancy with insufficient antenatal care, third trimester: Secondary | ICD-10-CM | POA: Diagnosis not present

## 2019-11-14 DIAGNOSIS — F1111 Opioid abuse, in remission: Secondary | ICD-10-CM | POA: Diagnosis present

## 2019-11-14 DIAGNOSIS — O0930 Supervision of pregnancy with insufficient antenatal care, unspecified trimester: Secondary | ICD-10-CM

## 2019-11-14 DIAGNOSIS — O26893 Other specified pregnancy related conditions, third trimester: Secondary | ICD-10-CM | POA: Diagnosis present

## 2019-11-14 DIAGNOSIS — Z3A34 34 weeks gestation of pregnancy: Secondary | ICD-10-CM | POA: Diagnosis not present

## 2019-11-14 DIAGNOSIS — F1110 Opioid abuse, uncomplicated: Secondary | ICD-10-CM | POA: Diagnosis present

## 2019-11-14 DIAGNOSIS — O99320 Drug use complicating pregnancy, unspecified trimester: Secondary | ICD-10-CM | POA: Diagnosis present

## 2019-11-14 DIAGNOSIS — T401X1A Poisoning by heroin, accidental (unintentional), initial encounter: Secondary | ICD-10-CM

## 2019-11-14 DIAGNOSIS — Z5329 Procedure and treatment not carried out because of patient's decision for other reasons: Secondary | ICD-10-CM | POA: Diagnosis present

## 2019-11-14 DIAGNOSIS — F1910 Other psychoactive substance abuse, uncomplicated: Secondary | ICD-10-CM | POA: Diagnosis not present

## 2019-11-14 LAB — COMPREHENSIVE METABOLIC PANEL
ALT: 10 U/L (ref 0–44)
AST: 17 U/L (ref 15–41)
Albumin: 2.4 g/dL — ABNORMAL LOW (ref 3.5–5.0)
Alkaline Phosphatase: 142 U/L — ABNORMAL HIGH (ref 38–126)
Anion gap: 9 (ref 5–15)
BUN: 6 mg/dL (ref 6–20)
CO2: 24 mmol/L (ref 22–32)
Calcium: 8.1 mg/dL — ABNORMAL LOW (ref 8.9–10.3)
Chloride: 106 mmol/L (ref 98–111)
Creatinine, Ser: 0.75 mg/dL (ref 0.44–1.00)
GFR calc Af Amer: >60 mL/min (ref >60)
GFR calc non Af Amer: >60 mL/min (ref >60)
Glucose, Bld: 64 mg/dL — ABNORMAL LOW (ref 70–99)
Potassium: 3.7 mmol/L (ref 3.5–5.1)
Sodium: 139 mmol/L (ref 135–145)
Total Bilirubin: 0.2 mg/dL — ABNORMAL LOW (ref 0.3–1.2)
Total Protein: 5.4 g/dL — ABNORMAL LOW (ref 6.5–8.1)

## 2019-11-14 LAB — CBC WITH DIFFERENTIAL/PLATELET
Abs Immature Granulocytes: 0.03 10*3/uL (ref 0.00–0.07)
Basophils Absolute: 0 10*3/uL (ref 0.0–0.1)
Basophils Relative: 1 %
Eosinophils Absolute: 0.4 10*3/uL (ref 0.0–0.5)
Eosinophils Relative: 6 %
HCT: 34.8 % — ABNORMAL LOW (ref 36.0–46.0)
Hemoglobin: 10.7 g/dL — ABNORMAL LOW (ref 12.0–15.0)
Immature Granulocytes: 0 %
Lymphocytes Relative: 32 %
Lymphs Abs: 2.3 10*3/uL (ref 0.7–4.0)
MCH: 30.4 pg (ref 26.0–34.0)
MCHC: 30.7 g/dL (ref 30.0–36.0)
MCV: 98.9 fL (ref 80.0–100.0)
Monocytes Absolute: 0.5 10*3/uL (ref 0.1–1.0)
Monocytes Relative: 8 %
Neutro Abs: 3.9 10*3/uL (ref 1.7–7.7)
Neutrophils Relative %: 53 %
Platelets: 236 10*3/uL (ref 150–400)
RBC: 3.52 MIL/uL — ABNORMAL LOW (ref 3.87–5.11)
RDW: 13 % (ref 11.5–15.5)
WBC: 7.2 10*3/uL (ref 4.0–10.5)
nRBC: 0 % (ref 0.0–0.2)

## 2019-11-14 LAB — TYPE AND SCREEN
ABO/RH(D): O NEG
Antibody Screen: NEGATIVE

## 2019-11-14 LAB — SARS CORONAVIRUS 2 BY RT PCR (HOSPITAL ORDER, PERFORMED IN ~~LOC~~ HOSPITAL LAB): SARS Coronavirus 2: NEGATIVE

## 2019-11-14 LAB — ETHANOL: Alcohol, Ethyl (B): <10 mg/dL (ref <10)

## 2019-11-14 MED ORDER — DEXTROSE IN LACTATED RINGERS 5 % IV SOLN: INTRAVENOUS | Status: DC

## 2019-11-14 MED ORDER — ENOXAPARIN SODIUM 40 MG/0.4ML ~~LOC~~ SOLN
40.0000 mg | SUBCUTANEOUS | Status: DC
  Administered 2019-11-14: 40 mg via SUBCUTANEOUS
  Filled 2019-11-14: qty 0.4

## 2019-11-14 MED ORDER — ZOLPIDEM TARTRATE 5 MG PO TABS
5.0000 mg | ORAL_TABLET | Freq: Every evening | ORAL | Status: DC | PRN
  Administered 2019-11-15: 5 mg via ORAL
  Filled 2019-11-14: qty 1

## 2019-11-14 MED ORDER — SERTRALINE HCL 50 MG PO TABS
50.0000 mg | ORAL_TABLET | Freq: Every day | ORAL | Status: DC
  Administered 2019-11-14 – 2019-11-15 (×2): 50 mg via ORAL
  Filled 2019-11-14 (×2): qty 1

## 2019-11-14 MED ORDER — ALPRAZOLAM 0.25 MG PO TABS
0.5000 mg | ORAL_TABLET | Freq: Every evening | ORAL | Status: DC | PRN
  Administered 2019-11-14: 0.5 mg via ORAL
  Filled 2019-11-14: qty 2

## 2019-11-14 MED ORDER — DOCUSATE SODIUM 100 MG PO CAPS
100.0000 mg | ORAL_CAPSULE | Freq: Every day | ORAL | Status: DC
  Administered 2019-11-14 – 2019-11-15 (×2): 100 mg via ORAL
  Filled 2019-11-14 (×2): qty 1

## 2019-11-14 MED ORDER — BETAMETHASONE SOD PHOS & ACET 6 (3-3) MG/ML IJ SUSP
12.0000 mg | INTRAMUSCULAR | Status: AC
  Administered 2019-11-14 – 2019-11-15 (×2): 12 mg via INTRAMUSCULAR
  Filled 2019-11-14: qty 5

## 2019-11-14 MED ORDER — CALCIUM CARBONATE ANTACID 500 MG PO CHEW: 2.0000 | CHEWABLE_TABLET | ORAL | Status: DC | PRN

## 2019-11-14 MED ORDER — ACETAMINOPHEN 325 MG PO TABS: 650.0000 mg | ORAL_TABLET | ORAL | Status: DC | PRN

## 2019-11-14 MED ORDER — PRENATAL MULTIVITAMIN CH
1.0000 | ORAL_TABLET | Freq: Every day | ORAL | Status: DC
  Filled 2019-11-14: qty 1

## 2019-11-14 MED ORDER — BUPRENORPHINE HCL 2 MG SL SUBL
4.0000 mg | SUBLINGUAL_TABLET | Freq: Every day | SUBLINGUAL | Status: DC
  Administered 2019-11-14: 4 mg via SUBLINGUAL
  Filled 2019-11-14: qty 2

## 2019-11-14 NOTE — ED Provider Notes (Signed)
[facility] EMERGENCY DEPARTMENT Provider Note   CSN: [id number] Arrival date & time: 11/14/19  0057     History Chief Complaint  Patient presents with  . Altered Mental Status  . Drug Overdose    Elaine Wallace is a 39 y.o. female.  Patient is a 39 year old female G3, P2 at suspected [redacted] weeks gestation.  She is brought by EMS after being found unresponsive in a car in the parking lot of a [employer].  She has a history of polysubstance abuse and IV drug use.  Patient was transported here by EMS uneventfully.  She denies to me she is having any abdominal pain or contractions.  She denies any vaginal bleeding.  Patient protecting her airway and is responsive for EMS.  Patient did not receive Narcan during transport.  The history is provided by the patient.  Altered Mental Status Presenting symptoms: partial responsiveness   Severity:  Moderate Most recent episode:  Today Episode history:  Single Timing:  Constant Progression:  Unchanged Chronicity:  Recurrent Context: drug use        Past Medical History:  Diagnosis Date  . Abnormal Pap smear   . Anxiety   . Broken neck (HCC) 2005  . Carpal tunnel syndrome    reports needed surgery, was taking pain pills and got addicted to them  . Drug addiction (HCC) 2021  . Herpes   . Shingles 02/2018  . Vaginal Pap smear, abnormal    HPV    Patient Active Problem List   Diagnosis Date Noted  . Complete abortion 06/07/2015    Past Surgical History:  Procedure Laterality Date  . NO PAST SURGERIES       OB History    Gravida  4   Para  2   Term  2   Preterm      AB  1   Living  2     SAB  1   TAB      Ectopic      Multiple      Live Births  2           Family History  Problem Relation Age of Onset  . Depression Mother   . Alcohol abuse Maternal Grandmother   . COPD Maternal Grandmother   . Alcohol abuse Maternal Grandfather   . Diabetes Paternal Grandmother   .  Cancer Paternal Grandfather        lung  . COPD Father     Social History   Tobacco Use  . Smoking status: Current Some Day Smoker  . Smokeless tobacco: Never Used  Vaping Use  . Vaping Use: Never used  Substance Use Topics  . Alcohol use: Not Currently  . Drug use: Yes    Frequency: 14.0 times per week    Comment: heroin     Home Medications Prior to Admission medications   Medication Sig Start Date End Date Taking? Authorizing Provider  acetaminophen (TYLENOL) 325 MG tablet Take 650 mg by mouth every 6 (six) hours as needed for mild pain or headache.    [provider]  ALPRAZolam Prudy Feeler) 0.5 MG tablet Take 0.5 mg by mouth at bedtime as needed for anxiety.    [provider]  ibuprofen (ADVIL,MOTRIN) 600 MG tablet Take 1 tablet (600 mg total) by mouth every 6 (six) hours as needed. 06/01/15   Aviva Signs, CNM  sertraline (ZOLOFT) 50 MG tablet Take 50 mg by mouth daily.  [provider]    Allergies    Penicillins  Review of Systems   Review of Systems  All other systems reviewed and are negative.   Physical Exam Updated Vital Signs LMP  (LMP Unknown) Comment: ? LMP in Jan  Physical Exam Vitals and nursing note reviewed.  Constitutional:      General: She is not in acute distress.    Appearance: She is well-developed. She is not diaphoretic.  HENT:     Head: Normocephalic and atraumatic.  Eyes:     Extraocular Movements: Extraocular movements intact.     Pupils: Pupils are equal, round, and reactive to light.     Comments: Pupils are 2 mm and sluggishly reactive.  Cardiovascular:     Rate and Rhythm: Normal rate and regular rhythm.     Heart sounds: No murmur heard.  No friction rub. No gallop.   Pulmonary:     Effort: Pulmonary effort is normal. No respiratory distress.     Breath sounds: Normal breath sounds. No wheezing.  Abdominal:     General: Bowel sounds are normal. There is no distension.     Palpations: Abdomen is  soft.     Tenderness: There is no abdominal tenderness.  Musculoskeletal:        General: Normal range of motion.     Cervical back: Normal range of motion and neck supple.  Skin:    General: Skin is warm and dry.  Neurological:     General: No focal deficit present.     Mental Status: She is alert.     Cranial Nerves: No cranial nerve deficit.     Comments: Patient is somnolent, but arousable.  She answers questions appropriately and follows commands.  She moves all extremities.  She does slur her words and appears under the influence.     ED Results / Procedures / Treatments   Labs (all labs ordered are listed, but only abnormal results are displayed) Labs Reviewed  COMPREHENSIVE METABOLIC PANEL  ETHANOL  CBC WITH DIFFERENTIAL/PLATELET  URINALYSIS, ROUTINE W REFLEX MICROSCOPIC  RAPID URINE DRUG SCREEN, HOSP PERFORMED    EKG None  Radiology No results found.  Procedures Procedures (including critical care time)  Medications Ordered in ED Medications - No data to display  ED Course  I have reviewed the triage vital signs and the nursing notes.  Pertinent labs & imaging results that were available during my care of the patient were reviewed by me and considered in my medical decision making (see chart for details).    MDM Rules/Calculators/A&P  Patient is a 39 year old female G3P2 at [redacted] weeks gestation.  She is brought in after an apparent heroin overdose.  Patient was found altered in a car in the parking lot of a [employer].  Patient has little additional history secondary to somnolence/intoxication.  Patient arrived by EMS.  She is somnolent, but arousable and communicative.  Her neurologic exam is otherwise nonfocal.  Patient denies any abdominal pain, cramping, or vaginal bleeding.  Patient was observed and had improvement in her mental status.    She was also placed on the fetal monitor which showed normal heart rate, but no contractions.  She was also seen  by rapid response OB who will take patient to MAU for further observation.  Final Clinical Impression(s) / ED Diagnoses Final diagnoses:  None    Rx / DC Orders ED Discharge Orders    None       Geoffery Lyons, MD  11/14/19 0318  

## 2019-11-14 NOTE — ED Triage Notes (Signed)
Pt found at [facility] after being found unresponsive in her car. Pt lethargic but will wake up and answer questions. Pt took trazodone 50mg  and xanax 0.5mg . Pt also admits to heroin use today

## 2019-11-14 NOTE — H&P (Signed)
Elaine Wallace is a 39 y.o. G3O7564 Estimated Date of Delivery: 12/28/19 [redacted]w[redacted]d no prenatal care   admitted after being found by a store worker unconscious in her car at a [employer] in [place].  She is a heroin user and left AMA from [facility] on 11/09/19.    She was being admitted for observation while arrangements for in house rehab were being made, however pt left AMA.    PMH:    Past Medical History:  Diagnosis Date  . Abnormal Pap smear   . Anxiety   . Broken neck (HCC) 2005  . Carpal tunnel syndrome    reports needed surgery, was taking pain pills and got addicted to them  . Drug addiction (HCC) 2021  . Herpes   . Shingles 02/2018  . Vaginal Pap smear, abnormal    HPV    PSH:     Past Surgical History:  Procedure Laterality Date  . NO PAST SURGERIES      POb/GynH:      OB History    Gravida  4   Para  2   Term  2   Preterm      AB  1   Living  2     SAB  1   TAB      Ectopic      Multiple      Live Births  2           SH:   Social History   Tobacco Use  . Smoking status: Current Some Day Smoker  . Smokeless tobacco: Never Used  Vaping Use  . Vaping Use: Never used  Substance Use Topics  . Alcohol use: Not Currently  . Drug use: Yes    Frequency: 14.0 times per week    Comment: heroin     FH:    Family History  Problem Relation Age of Onset  . Depression Mother   . Alcohol abuse Maternal Grandmother   . COPD Maternal Grandmother   . Alcohol abuse Maternal Grandfather   . Diabetes Paternal Grandmother   . Cancer Paternal Grandfather        lung  . COPD Father      Allergies:  Allergies  Allergen Reactions  . Penicillins Other (See Comments)    Unknown childhood reaction    Medications:       Current Facility-Administered Medications:  .  acetaminophen (TYLENOL) tablet 650 mg, 650 mg, Oral, Q4H PRN, Lazaro Arms, MD .  ALPRAZolam Prudy Feeler) tablet 0.5 mg, 0.5 mg, Oral, QHS PRN, Lazaro Arms, MD .  betamethasone  acetate-betamethasone sodium phosphate (CELESTONE) injection 12 mg, 12 mg, Intramuscular, Q24H, Keaston Pile, Amaryllis Dyke, MD .  calcium carbonate (TUMS - dosed in mg elemental calcium) chewable tablet 400 mg of elemental calcium, 2 tablet, Oral, Q4H PRN, Lazaro Arms, MD .  dextrose 5 % in lactated ringers infusion, , Intravenous, Continuous, Viann Nielson, Amaryllis Dyke, MD .  docusate sodium (COLACE) capsule 100 mg, 100 mg, Oral, Daily, Harlan Ervine H, MD .  enoxaparin (LOVENOX) injection 40 mg, 40 mg, Subcutaneous, Q24H, Ari Bernabei H, MD .  prenatal multivitamin tablet 1 tablet, 1 tablet, Oral, Q1200, Lazaro Arms, MD .  sertraline (ZOLOFT) tablet 50 mg, 50 mg, Oral, Daily, Hargis Vandyne H, MD .  zolpidem (AMBIEN) tablet 5 mg, 5 mg, Oral, QHS PRN, Lazaro Arms, MD  Current Outpatient Medications:  .  ALPRAZolam (XANAX) 0.5 MG tablet, Take 0.5 mg by  mouth at bedtime as needed for anxiety., Disp: , Rfl:  .  sertraline (ZOLOFT) 50 MG tablet, Take 50 mg by mouth daily., Disp: , Rfl:  .  acetaminophen (TYLENOL) 325 MG tablet, Take 650 mg by mouth every 6 (six) hours as needed for mild pain or headache., Disp: , Rfl:  .  ibuprofen (ADVIL,MOTRIN) 600 MG tablet, Take 1 tablet (600 mg total) by mouth every 6 (six) hours as needed., Disp: 30 tablet, Rfl: 0  Review of Systems:   Review of Systems  Constitutional: Negative for fever, chills, weight loss, malaise/fatigue and diaphoresis.  HENT: Negative for hearing loss, ear pain, nosebleeds, congestion, sore throat, neck pain, tinnitus and ear discharge.   Eyes: Negative for blurred vision, double vision, photophobia, pain, discharge and redness.  Respiratory: Negative for cough, hemoptysis, sputum production, shortness of breath, wheezing and stridor.   Cardiovascular: Negative for chest pain, palpitations, orthopnea, claudication, leg swelling and PND.  Gastrointestinal: Positive for abdominal pain. Negative for heartburn, nausea, vomiting, diarrhea, constipation,  blood in stool and melena.  Genitourinary: Negative for dysuria, urgency, frequency, hematuria and flank pain.  Musculoskeletal: Negative for myalgias, back pain, joint pain and falls.  Skin: Negative for itching and rash.  Neurological: Negative for dizziness, tingling, tremors, sensory change, speech change, focal weakness, seizures, loss of consciousness, weakness and headaches.  Endo/Heme/Allergies: Negative for environmental allergies and polydipsia. Does not bruise/bleed easily.  Psychiatric/Behavioral: Negative for depression, suicidal ideas, hallucinations, memory loss and substance abuse. The patient is not nervous/anxious and does not have insomnia.      PHYSICAL EXAM:  Blood pressure 98/77, pulse 68, resp. rate 11, SpO2 99 %, unknown if currently breastfeeding.    Vitals reviewed. Constitutional: She is oriented to person, place, and time. She appears well-developed and well-nourished.  HENT:  Head: Normocephalic and atraumatic.  Right Ear: External ear normal.  Left Ear: External ear normal.  Nose: Nose normal.  Mouth/Throat: Oropharynx is clear and moist.  Eyes: Conjunctivae and EOM are normal. Pupils are equal, round, and reactive to light. Right eye exhibits no discharge. Left eye exhibits no discharge. No scleral icterus.  Neck: Normal range of motion. Neck supple. No tracheal deviation present. No thyromegaly present.  Cardiovascular: Normal rate, regular rhythm, normal heart sounds and intact distal pulses.  Exam reveals no gallop and no friction rub.   No murmur heard. Respiratory: Effort normal and breath sounds normal. No respiratory distress. She has no wheezes. She has no rales. She exhibits no tenderness.  GI: Soft. Bowel sounds are normal. She exhibits no distension and no mass. There is tenderness. There is no rebound and no guarding.  Genitourinary:       Vulva is normal without lesions Vagina is pink moist without discharge Cervix normal in appearance and  pap is normal Uterus is enlarged, 32 weeks size Adnexa is negative with normal sized ovaries by sonogram  Musculoskeletal: Normal range of motion. She exhibits no edema and no tenderness.  Neurological: She is alert and oriented to person, place, and time. She has normal reflexes. She displays normal reflexes. No cranial nerve deficit. She exhibits normal muscle tone. Coordination normal.  Skin: Skin is warm and dry. No rash noted. No erythema. No pallor.  Psychiatric: She has a normal mood and affect. Her behavior is normal. Judgment and thought content normal.    Labs: Results for orders placed or performed during the hospital encounter of 11/14/19 (from the past 336 hour(s))  Comprehensive metabolic panel   Collection Time:  11/14/19  1:11 AM  Result Value Ref Range   Sodium 139 135 - 145 mmol/L   Potassium 3.7 3.5 - 5.1 mmol/L   Chloride 106 98 - 111 mmol/L   CO2 24 22 - 32 mmol/L   Glucose, Bld 64 (L) 70 - 99 mg/dL   BUN 6 6 - 20 mg/dL   Creatinine, Ser 5.40 0.44 - 1.00 mg/dL   Calcium 8.1 (L) 8.9 - 10.3 mg/dL   Total Protein 5.4 (L) 6.5 - 8.1 g/dL   Albumin 2.4 (L) 3.5 - 5.0 g/dL   AST 17 15 - 41 U/L   ALT 10 0 - 44 U/L   Alkaline Phosphatase 142 (H) 38 - 126 U/L   Total Bilirubin 0.2 (L) 0.3 - 1.2 mg/dL   GFR calc non Af Amer >60 >60 mL/min   GFR calc Af Amer >60 >60 mL/min   Anion gap 9 5 - 15  Ethanol   Collection Time: 11/14/19  1:11 AM  Result Value Ref Range   Alcohol, Ethyl (B) <10 <10 mg/dL  CBC with Differential   Collection Time: 11/14/19  1:11 AM  Result Value Ref Range   WBC 7.2 4.0 - 10.5 K/uL   RBC 3.52 (L) 3.87 - 5.11 MIL/uL   Hemoglobin 10.7 (L) 12.0 - 15.0 g/dL   HCT 98.1 (L) 36 - 46 %   MCV 98.9 80.0 - 100.0 fL   MCH 30.4 26.0 - 34.0 pg   MCHC 30.7 30.0 - 36.0 g/dL   RDW 19.1 47.8 - 29.5 %   Platelets 236 150 - 400 K/uL   nRBC 0.0 0.0 - 0.2 %   Neutrophils Relative % 53 %   Neutro Abs 3.9 1.7 - 7.7 K/uL   Lymphocytes Relative 32 %   Lymphs Abs  2.3 0.7 - 4.0 K/uL   Monocytes Relative 8 %   Monocytes Absolute 0.5 0 - 1 K/uL   Eosinophils Relative 6 %   Eosinophils Absolute 0.4 0 - 0 K/uL   Basophils Relative 1 %   Basophils Absolute 0.0 0 - 0 K/uL   Immature Granulocytes 0 %   Abs Immature Granulocytes 0.03 0.00 - 0.07 K/uL  Results for orders placed or performed during the hospital encounter of 11/09/19 (from the past 336 hour(s))  Urinalysis, Routine w reflex microscopic   Collection Time: 11/09/19  1:01 PM  Result Value Ref Range   Color, Urine YELLOW YELLOW   APPearance CLEAR CLEAR   Specific Gravity, Urine 1.011 1.005 - 1.030   pH 9.0 (H) 5.0 - 8.0   Glucose, UA NEGATIVE NEGATIVE mg/dL   Hgb urine dipstick NEGATIVE NEGATIVE   Bilirubin Urine NEGATIVE NEGATIVE   Ketones, ur 20 (A) NEGATIVE mg/dL   Protein, ur NEGATIVE NEGATIVE mg/dL   Nitrite NEGATIVE NEGATIVE   Leukocytes,Ua LARGE (A) NEGATIVE   RBC / HPF 0-5 0 - 5 RBC/hpf   WBC, UA 6-10 0 - 5 WBC/hpf   Bacteria, UA RARE (A) NONE SEEN   Squamous Epithelial / LPF 0-5 0 - 5  CBC   Collection Time: 11/09/19  1:30 PM  Result Value Ref Range   WBC 6.7 4.0 - 10.5 K/uL   RBC 3.72 (L) 3.87 - 5.11 MIL/uL   Hemoglobin 11.4 (L) 12.0 - 15.0 g/dL   HCT 62.1 (L) 36 - 46 %   MCV 93.8 80.0 - 100.0 fL   MCH 30.6 26.0 - 34.0 pg   MCHC 32.7 30.0 - 36.0 g/dL   RDW  12.7 11.5 - 15.5 %   Platelets 263 150 - 400 K/uL   nRBC 0.0 0.0 - 0.2 %  Comprehensive metabolic panel   Collection Time: 11/09/19  1:30 PM  Result Value Ref Range   Sodium 135 135 - 145 mmol/L   Potassium 3.0 (L) 3.5 - 5.1 mmol/L   Chloride 105 98 - 111 mmol/L   CO2 21 (L) 22 - 32 mmol/L   Glucose, Bld 86 70 - 99 mg/dL   BUN 7 6 - 20 mg/dL   Creatinine, Ser 7.82 0.44 - 1.00 mg/dL   Calcium 8.4 (L) 8.9 - 10.3 mg/dL   Total Protein 6.0 (L) 6.5 - 8.1 g/dL   Albumin 2.8 (L) 3.5 - 5.0 g/dL   AST 24 15 - 41 U/L   ALT 13 0 - 44 U/L   Alkaline Phosphatase 173 (H) 38 - 126 U/L   Total Bilirubin 0.7 0.3 - 1.2  mg/dL   GFR calc non Af Amer >60 >60 mL/min   GFR calc Af Amer >60 >60 mL/min   Anion gap 9 5 - 15  GC/Chlamydia probe amp ([place])not at [facility]   Collection Time: 11/09/19  3:40 PM  Result Value Ref Range   Neisseria Gonorrhea Negative    Chlamydia Negative    Comment Normal Reference Ranger Chlamydia - Negative    Comment      Normal Reference Range Neisseria Gonorrhea - Negative  Wet prep, genital   Collection Time: 11/09/19  4:00 PM   Specimen: Vaginal  Result Value Ref Range   Yeast Wet Prep HPF POC NONE SEEN NONE SEEN   Trich, Wet Prep PRESENT (A) NONE SEEN   Clue Cells Wet Prep HPF POC PRESENT (A) NONE SEEN   WBC, Wet Prep HPF POC MANY (A) NONE SEEN   Sperm NONE SEEN   RPR   Collection Time: 11/09/19  4:44 PM  Result Value Ref Range   RPR Ser Ql NON REACTIVE NON REACTIVE  HIV Antibody (routine testing w rflx)   Collection Time: 11/09/19  4:44 PM  Result Value Ref Range   HIV Screen 4th Generation wRfx Non Reactive Non Reactive  Hepatitis B surface antigen   Collection Time: 11/09/19  4:44 PM  Result Value Ref Range   Hepatitis B Surface Ag NON REACTIVE NON REACTIVE  Hepatitis C antibody   Collection Time: 11/09/19  4:44 PM  Result Value Ref Range   HCV Ab NON REACTIVE NON REACTIVE  Hemoglobin A1c   Collection Time: 11/09/19  4:45 PM  Result Value Ref Range   Hgb A1c MFr Bld 5.1 4.8 - 5.6 %   Mean Plasma Glucose 99.67 mg/dL  Amnisure rupture of membrane (rom)not at [facility]   Collection Time: 11/09/19  5:05 PM  Result Value Ref Range   Amnisure ROM NEGATIVE    EFM: FHR 120s reactive good variability no decels noted  EKG: No orders found for this or any previous visit.  Imaging Studies: [place] MFM OB LIMITED  Result Date: 11/09/2019 ----------------------------------------------------------------------  OBSTETRICS REPORT                         (Signed Final 11/09/2019 05:18 pm) ---------------------------------------------------------------------- Patient Info   ID #:        [id number]                          D.O.B.:  [birth year] (38 yrs)  Name:  Elaine Wallace               Visit Date: 11/09/2019 04:07 pm ---------------------------------------------------------------------- Performed By  Attending:         Ma Rings MD         Ref. Address:      Faculty Practice  Performed By:      Tomma Lightning             [facility]  Referred By:       Gerrit Heck CNM ---------------------------------------------------------------------- Orders  #   Description                          Code         Ordered By  1   [place] MFM OB LIMITED                    76815.01     Heydy EMLY ----------------------------------------------------------------------  #   Order #                    Accession #                 Episode #  1   [id number]                  [id number]                  [id number] ---------------------------------------------------------------------- Indications  Insufficient Prenatal Care (no prenatal care)   O09.30  Encounter for uncertain dates                   Z36.87  [redacted] weeks gestation of pregnancy                 Z3A.33  Drug use complicating pregnancy, third          O99.323  trimester (Heroin use twice daily)  Advanced maternal age multigravida 11+, third   O75.523  trimester  Abdominal pain in pregnancy                     O99.89  Smoking complicating pregnancy, third           O99.333  trimester ---------------------------------------------------------------------- Fetal Evaluation  Num Of Fetuses:          1  Fetal Heart Rate(bpm):   155  Cardiac Activity:        Observed  Presentation:            Cephalic  Placenta:                No abruption or previa seen, posterior  Amniotic Fluid  AFI FV:      Within normal limits  AFI Sum(cm)     %Tile       Largest Pocket(cm)  6.3             < 3         3.  RUQ(cm)       RLQ(cm)  LUQ(cm)        LLQ(cm)  1.9           1.4             3              0 ---------------------------------------------------------------------- Biometry  BPD:        80   mm     G. Age:  32w 1d         20  %                                                           FL/BPD:      81.8  %    71 - 87  FL:       65.4   mm     G. Age:  33w 5d         58  % ---------------------------------------------------------------------- OB History  Gravidity:     4         Term:  2          SAB:   1  Living:        2 ---------------------------------------------------------------------- Gestational Age  U/S Today:      33w 0d                                         EDD:  12/28/19  Best:           33w 0d    Det. By:  U/S (11/09/19)             EDD:  12/28/19 ---------------------------------------------------------------------- Anatomy  Stomach:                Appears normal, left   Bladder:                Appears normal                          sided  Kidneys:                Appear normal ---------------------------------------------------------------------- Cervix Uterus Adnexa  Cervix  Not visualized (advanced GA >24wks) ---------------------------------------------------------------------- Comments  This patient presented to the MAU for evaluation.  She has not  received any prenatal care in her current pregnancy.  She may  also be withdrawing from drug use.  A limited ultrasound performed today shows a total AFI of 6.3  cm. A 3 cm vertical pocket of amniotic fluid is noted.  The patient has a reactive fetal heart rate tracing.  Due to the low normal amniotic fluid noted today, she should be  ruled out for rupture of membranes.  C  Consideration should be given to overnight observation with IV  hydration.  A repeat ultrasound should be scheduled tomorrow  for a detailed ultrasound for assessment of the fetal anatomy  and amniotic fluid levels.  She should receive a course of  antenatal corticosteroids should she require delivery in the near  future.  Further recommendations  for the management of her pregnancy  will depend on the amniotic fluid level noted on tomorrow's  ultrasound exam. ----------------------------------------------------------------------  Ma RingsVictor Fang, MD Electronically Signed Final Report   11/09/2019 05:18 pm ----------------------------------------------------------------------     Assessment: 7365w5d G4P2012 Estimated Date of Delivery: 12/28/19  Found unresponsive due to heroin use, no narcan was given Patient Active Problem List   Diagnosis Date Noted  . Drug abuse during pregnancy (HCC) 11/14/2019  . Complete abortion 06/07/2015    Plan: Admit for observation and again attempt to place patient if that is still her desire BMZ x2 to be given  Continuous EFM for now  Lazaro ArmsLuther H Kamerin Grumbine 11/14/2019 3:16 AM

## 2019-11-14 NOTE — Progress Notes (Signed)
Faculty Note  Down to see patient, she is sweating, having stomach pain. Requesting something to stop her withdrawal and "get help." States she never thought she would end up being pregnant and doing heroin, only doing heroin because she could not afford pain meds and is only taking enough to stop her from "being sick."   Last used around 4 pm yesterday.  Reviewed recommendation to start suboxone as it is better tolerated by fetus, she is requesting the "one with no blocker" as she has taken both before and has been very sick with the "the one with the blocker". Reviewed she will have to transition post partum to suboxone, that fetal withdrawal is less with suboxone, and she still refuses suboxone and requests subutex. Will start patient on subutex, she will follow up in our office for care.   Subutex 4 mg daily started SW has seen patient and working on outpatient rehab   K. Therese Sarah, M.D. Attending Center for [employer] [facility])

## 2019-11-14 NOTE — Clinical SW OB High Risk (Signed)
Clinical Social Work Antenatal   Clinical Social Worker:  Dimple Nanas, LCSW Date/Time:  11/14/2019, 3:53 PM Gestational Age on Admission:  39 y.o. Admitting Diagnosis:  November 14, 2019   Expected Delivery Date:  12/25/19  [facility] Environment  Home Address: [place] [address]  Household Member/Support Name:  Patient reported that her mother Leonidas Romberg lives with her.  Relationship:    Other Support:    Psychosocial Data  Information Source:  Patient Interview Resources:      Employment:  unemployed   Medicaid [facility]):  [place]  School:     Current Grade:  Patient reported she completed the 12th grade.  Homebound Arranged: No  Other Resources:  Medicaid  Cultural/Environment Issues Impacting Care:  Substance addiction (Heroin)   Strengths/Weaknesses/Factors to Consider  Concerns Related to Hospitalization:  Recent overdose    Previous Pregnancies/Feelings Towards Pregnancy?  Concerns related to being/becoming a mother?: Patient is actively using illicit substances.   Social Support (FOB? Who is/will be helping with baby/other kids?): Patient declined to share any information about FOB.  Couples Relationship (describe): unknown   Recent Stressful Life Events (life changes in past year?):  None reported   Prenatal Care/Education/Home Preparations: Patient reported that she has a friend that will be providing all essential items for infant.    Domestic Violence (of any type):  No If Yes to Domestic Violence, Describe/Action Plan:     Substance Use During Pregnancy: Yes (If Yes, Complete SBIRT)  Complete PHQ-9 (Depresssion Screening) on all Antenatal Patients PHQ-9 Score (If Score => 15 complete TREAT):     Follow-up Recommendations: Patient agreed to think about if she wants to peruse inpatient or outpatient substance treatment.   Clinical Assessment/Plan: CSW met with patient in room 105 to complete an assessment  for recent drug overdose and patient's desire for substance treatment. When CSW arrived, patient was resting in bed and patient's mother was resting on the couch.  CSW explained CSW's role and with patient's permission, CSW asked patient's mother to leave the room in order to assess patient in private;  Patient's mother left without incident. Patient appeared tired and sleepy as evidence by repeatedly closing her eyes during the assessment. CSW had to consistently re-direct patient and request her to focus on CSW questions. Patient was soft spoken, polite, and receptive to meeting with CSW.   CSW inquired about patient's substance use and patient reported, "I last used heroin about 24 hours ago." Per patient, patient was introduced to heroin 6 months ago to assist with managing her pain and her with withdrawal symptoms from patient having a lack of medication. Patient reported that she has purchased Suboxone and Subutex "off the streets, " but can not afford to pay for it daily. CSW ask patient if she was interested in treatment and patient responded "Yes."  Patient made it clear to CSW that she was only interested outpatient treatment at this time. Patient reported having reliable transportation and denied having any barriers with going to a medication management clinic daily. Patient communicated, "I just want help.  I don't want to be on drugs.  I just don't want to be sick.  This withdrawal stuff is bad." CSW validated and normalized MOB's thoughts and feelings and encouraged her to follow through with treatment. Patient reported having a hx of anxiety and depression and reported her symptoms are managed with Zoloft and Xanax.  Patient also reported if she is unable to get withdrawal treatment while she  is admitted, she will leave AMA. CSW agreed to speak with [facility] provider regarding options for patient.   CSW updated RN and OB provider and a plan was formalize to assist patient while she remains inpatient  and outpatient.   CSW also spoke with Ernestene Kiel at [facility] regarding potential outpatient treatment. CSW will discuss details with patient to see if she wants to participate.   CSW will continue to offer resources and supports to patient while she remains inpatient.   Laurey Arrow, MSW, LCSW Clinical Social Work [phone]

## 2019-11-14 NOTE — Progress Notes (Signed)
Pt is [redacted]w[redacted]d G4P2 found unresponsive at [employer] in her car. Per EMS patient reports taking 50mg  trazodone and 0.5mg  Xanax. Patient also reports using Heroin today.   Dr. at bedside upon arrival.   Patient denies contractions or leaking of fluid.   Per Dr. Despina Hidden once Dr. Despina Hidden (EDP) has cleared patient she may be transferred to [facility].   Fetal heart tracing CAT I at this time.

## 2019-11-14 NOTE — Progress Notes (Signed)
Patient cleared by Dr. Judd Lien (EDP). Dr. Despina Hidden notified and gve verbal order for admit to [facility] and continuous fetal monitoring.

## 2019-11-15 DIAGNOSIS — O99320 Drug use complicating pregnancy, unspecified trimester: Secondary | ICD-10-CM

## 2019-11-15 DIAGNOSIS — Z3A34 34 weeks gestation of pregnancy: Secondary | ICD-10-CM

## 2019-11-15 DIAGNOSIS — F1111 Opioid abuse, in remission: Secondary | ICD-10-CM | POA: Diagnosis present

## 2019-11-15 DIAGNOSIS — F1120 Opioid dependence, uncomplicated: Secondary | ICD-10-CM

## 2019-11-15 DIAGNOSIS — R4182 Altered mental status, unspecified: Secondary | ICD-10-CM | POA: Diagnosis present

## 2019-11-15 DIAGNOSIS — O0930 Supervision of pregnancy with insufficient antenatal care, unspecified trimester: Secondary | ICD-10-CM

## 2019-11-15 DIAGNOSIS — F1110 Opioid abuse, uncomplicated: Secondary | ICD-10-CM

## 2019-11-15 NOTE — Discharge Summary (Signed)
Antenatal Physician Discharge Summary   Patient left against medical advice.  Patient ID: Elaine Wallace MRN: [id number] DOB/AGE: Jan 08, 1981 39 y.o.  Admit date: 11/14/2019 Discharge date: 11/15/2019  Admission Diagnoses: heroin overdose  Discharge Diagnoses:  Principal Problem:   Drug abuse during pregnancy [facility]) Active Problems:   [redacted] weeks gestation of pregnancy   History of heroin abuse (HCC)   Altered mental status   Limited prenatal care   Pregnancy complicated by subutex maintenance, antepartum (HCC)  Prenatal Procedures: NST and ultrasound  Hospital Course:  This is a 39 y.o. [id number] with IUP at [redacted]w[redacted]d admitted for heroin overdose. She was found unconscious in her car and brought in, did not require narcan. Her level of consciousness improved over the night and she was feeling well by the next morning. Started to have signs of withdrawal and requesting help, started on subutex with plans to follow throughout pregnancy and get her into rehab tomorrow. On HD#3, she left AMA prior to being discharged and set up for pregnancy care. I was not able to speak with her prior to leaving. Office has called to attempt to schedule her and unable to get in touch, LCSW will follow up with her.   LEFT AGAINST MEDICAL ADVICE  Discharge Exam: Temp:  [96.7 F (35.9 C)-98.1 F (36.7 C)] 97.9 F (36.6 C) (09/22 0753) Pulse Rate:  [59-100] 61 (09/22 0753) Resp:  [16-18] 16 (09/22 0753) BP: (111-123)/(68-80) 117/79 (09/22 0753) SpO2:  [99 %-100 %] 99 % (09/22 0753) Weight:  [56.2 kg] 56.2 kg (09/21 1735)  Significant Diagnostic Studies:  Results for orders placed or performed during the hospital encounter of 11/14/19 (from the past 168 hour(s))  Comprehensive metabolic panel   Collection Time: 11/14/19  1:11 AM  Result Value Ref Range   Sodium 139 135 - 145 mmol/L   Potassium 3.7 3.5 - 5.1 mmol/L   Chloride 106 98 - 111 mmol/L   CO2 24 22 - 32 mmol/L   Glucose, Bld 64 (L) 70 - 99  mg/dL   BUN 6 6 - 20 mg/dL   Creatinine, Ser 5.05 0.44 - 1.00 mg/dL   Calcium 8.1 (L) 8.9 - 10.3 mg/dL   Total Protein 5.4 (L) 6.5 - 8.1 g/dL   Albumin 2.4 (L) 3.5 - 5.0 g/dL   AST 17 15 - 41 U/L   ALT 10 0 - 44 U/L   Alkaline Phosphatase 142 (H) 38 - 126 U/L   Total Bilirubin 0.2 (L) 0.3 - 1.2 mg/dL   GFR calc non Af Amer >60 >60 mL/min   GFR calc Af Amer >60 >60 mL/min   Anion gap 9 5 - 15  Ethanol   Collection Time: 11/14/19  1:11 AM  Result Value Ref Range   Alcohol, Ethyl (B) <10 <10 mg/dL  CBC with Differential   Collection Time: 11/14/19  1:11 AM  Result Value Ref Range   WBC 7.2 4.0 - 10.5 K/uL   RBC 3.52 (L) 3.87 - 5.11 MIL/uL   Hemoglobin 10.7 (L) 12.0 - 15.0 g/dL   HCT 39.7 (L) 36 - 46 %   MCV 98.9 80.0 - 100.0 fL   MCH 30.4 26.0 - 34.0 pg   MCHC 30.7 30.0 - 36.0 g/dL   RDW 67.3 41.9 - 37.9 %   Platelets 236 150 - 400 K/uL   nRBC 0.0 0.0 - 0.2 %   Neutrophils Relative % 53 %   Neutro Abs 3.9 1.7 - 7.7 K/uL   Lymphocytes Relative  32 %   Lymphs Abs 2.3 0.7 - 4.0 K/uL   Monocytes Relative 8 %   Monocytes Absolute 0.5 0 - 1 K/uL   Eosinophils Relative 6 %   Eosinophils Absolute 0.4 0 - 0 K/uL   Basophils Relative 1 %   Basophils Absolute 0.0 0 - 0 K/uL   Immature Granulocytes 0 %   Abs Immature Granulocytes 0.03 0.00 - 0.07 K/uL  SARS Coronavirus 2 by RT PCR (hospital order, performed in [facility] hospital lab) Nasopharyngeal Nasopharyngeal Swab   Collection Time: 11/14/19  1:14 AM   Specimen: Nasopharyngeal Swab  Result Value Ref Range   SARS Coronavirus 2 NEGATIVE NEGATIVE  Type and screen [facility]   Collection Time: 11/14/19  4:54 AM  Result Value Ref Range   ABO/RH(D) O NEG    Antibody Screen NEG    Sample Expiration      11/17/2019,2359 Performed at [facility], [address] [place], [place] [address]   Results for orders placed or performed during the hospital encounter of 11/09/19 (from the past 168 hour(s))   Urinalysis, Routine w reflex microscopic   Collection Time: 11/09/19  1:01 PM  Result Value Ref Range   Color, Urine YELLOW YELLOW   APPearance CLEAR CLEAR   Specific Gravity, Urine 1.011 1.005 - 1.030   pH 9.0 (H) 5.0 - 8.0   Glucose, UA NEGATIVE NEGATIVE mg/dL   Hgb urine dipstick NEGATIVE NEGATIVE   Bilirubin Urine NEGATIVE NEGATIVE   Ketones, ur 20 (A) NEGATIVE mg/dL   Protein, ur NEGATIVE NEGATIVE mg/dL   Nitrite NEGATIVE NEGATIVE   Leukocytes,Ua LARGE (A) NEGATIVE   RBC / HPF 0-5 0 - 5 RBC/hpf   WBC, UA 6-10 0 - 5 WBC/hpf   Bacteria, UA RARE (A) NONE SEEN   Squamous Epithelial / LPF 0-5 0 - 5  CBC   Collection Time: 11/09/19  1:30 PM  Result Value Ref Range   WBC 6.7 4.0 - 10.5 K/uL   RBC 3.72 (L) 3.87 - 5.11 MIL/uL   Hemoglobin 11.4 (L) 12.0 - 15.0 g/dL   HCT 16.3 (L) 36 - 46 %   MCV 93.8 80.0 - 100.0 fL   MCH 30.6 26.0 - 34.0 pg   MCHC 32.7 30.0 - 36.0 g/dL   RDW 84.5 36.4 - 68.0 %   Platelets 263 150 - 400 K/uL   nRBC 0.0 0.0 - 0.2 %  Comprehensive metabolic panel   Collection Time: 11/09/19  1:30 PM  Result Value Ref Range   Sodium 135 135 - 145 mmol/L   Potassium 3.0 (L) 3.5 - 5.1 mmol/L   Chloride 105 98 - 111 mmol/L   CO2 21 (L) 22 - 32 mmol/L   Glucose, Bld 86 70 - 99 mg/dL   BUN 7 6 - 20 mg/dL   Creatinine, Ser 3.21 0.44 - 1.00 mg/dL   Calcium 8.4 (L) 8.9 - 10.3 mg/dL   Total Protein 6.0 (L) 6.5 - 8.1 g/dL   Albumin 2.8 (L) 3.5 - 5.0 g/dL   AST 24 15 - 41 U/L   ALT 13 0 - 44 U/L   Alkaline Phosphatase 173 (H) 38 - 126 U/L   Total Bilirubin 0.7 0.3 - 1.2 mg/dL   GFR calc non Af Amer >60 >60 mL/min   GFR calc Af Amer >60 >60 mL/min   Anion gap 9 5 - 15  GC/Chlamydia probe amp ([place])not at [facility]   Collection Time: 11/09/19  3:40 PM  Result Value Ref Range   Neisseria Gonorrhea Negative    Chlamydia Negative    Comment Normal Reference Ranger Chlamydia - Negative    Comment      Normal Reference Range Neisseria Gonorrhea - Negative  Wet  prep, genital   Collection Time: 11/09/19  4:00 PM   Specimen: Vaginal  Result Value Ref Range   Yeast Wet Prep HPF POC NONE SEEN NONE SEEN   Trich, Wet Prep PRESENT (A) NONE SEEN   Clue Cells Wet Prep HPF POC PRESENT (A) NONE SEEN   WBC, Wet Prep HPF POC MANY (A) NONE SEEN   Sperm NONE SEEN   RPR   Collection Time: 11/09/19  4:44 PM  Result Value Ref Range   RPR Ser Ql NON REACTIVE NON REACTIVE  HIV Antibody (routine testing w rflx)   Collection Time: 11/09/19  4:44 PM  Result Value Ref Range   HIV Screen 4th Generation wRfx Non Reactive Non Reactive  Hepatitis B surface antigen   Collection Time: 11/09/19  4:44 PM  Result Value Ref Range   Hepatitis B Surface Ag NON REACTIVE NON REACTIVE  Hepatitis C antibody   Collection Time: 11/09/19  4:44 PM  Result Value Ref Range   HCV Ab NON REACTIVE NON REACTIVE  Hemoglobin A1c   Collection Time: 11/09/19  4:45 PM  Result Value Ref Range   Hgb A1c MFr Bld 5.1 4.8 - 5.6 %   Mean Plasma Glucose 99.67 mg/dL  Amnisure rupture of membrane (rom)not at [facility]   Collection Time: 11/09/19  5:05 PM  Result Value Ref Range   Amnisure ROM NEGATIVE     Discharge Condition: Stable  Disposition: left AMA  Signed: Baldemar Lenis, M.D. Attending Center for [employer] (Faculty Practice)  11/15/2019, 10:45 AM

## 2019-11-15 NOTE — Care Management (Signed)
CM screened patient for medication needs.  CM spoke to [employer] financial counselor and she informed CM that patient has active Medicaid with $3.00 copay for prescription coverage.  CM made CSW aware.  Gretchen Short RNC-MNN, BSN Transitions of Care [facility]

## 2019-11-15 NOTE — Progress Notes (Signed)
Pt left AMA °

## 2019-11-15 NOTE — Progress Notes (Signed)
CSW attempted to meet with MOB in room 105 to update her regarding outpatient treatment options. When CSW arrived, CSW was informed that MOB left AMA.  CSW attempted contact MOB via telephone and was not success.  MOB did not have a voicemail box.   Blaine Hamper, MSW, LCSW Clinical Social Work [phone]

## 2019-11-16 ENCOUNTER — Encounter: Payer: Self-pay | Admitting: Obstetrics & Gynecology

## 2019-12-08 ENCOUNTER — Inpatient Hospital Stay (HOSPITAL_COMMUNITY)
Admission: AD | Admit: 2019-12-08 | Discharge: 2019-12-10 | DRG: 807 | Disposition: A | Discharge status: Home / Self Care | Payer: Medicaid Other | Attending: Obstetrics & Gynecology | Admitting: Obstetrics & Gynecology

## 2019-12-08 DIAGNOSIS — O26893 Other specified pregnancy related conditions, third trimester: Secondary | ICD-10-CM | POA: Diagnosis present

## 2019-12-08 DIAGNOSIS — Z20822 Contact with and (suspected) exposure to covid-19: Secondary | ICD-10-CM | POA: Diagnosis present

## 2019-12-08 DIAGNOSIS — Z6791 Unspecified blood type, Rh negative: Secondary | ICD-10-CM | POA: Diagnosis not present

## 2019-12-08 DIAGNOSIS — Z3A37 37 weeks gestation of pregnancy: Secondary | ICD-10-CM | POA: Diagnosis not present

## 2019-12-08 DIAGNOSIS — F17210 Nicotine dependence, cigarettes, uncomplicated: Secondary | ICD-10-CM | POA: Diagnosis present

## 2019-12-08 DIAGNOSIS — O99334 Smoking (tobacco) complicating childbirth: Secondary | ICD-10-CM | POA: Diagnosis present

## 2019-12-08 DIAGNOSIS — F1110 Opioid abuse, uncomplicated: Secondary | ICD-10-CM | POA: Diagnosis present

## 2019-12-08 DIAGNOSIS — O99324 Drug use complicating childbirth: Secondary | ICD-10-CM | POA: Diagnosis present

## 2019-12-08 DIAGNOSIS — O99323 Drug use complicating pregnancy, third trimester: Secondary | ICD-10-CM

## 2019-12-08 LAB — RESPIRATORY PANEL BY RT PCR (FLU A&B, COVID)
Influenza A by PCR: NEGATIVE
Influenza B by PCR: NEGATIVE
SARS Coronavirus 2 by RT PCR: NEGATIVE

## 2019-12-08 LAB — TYPE AND SCREEN
ABO/RH(D): O NEG
Antibody Screen: NEGATIVE

## 2019-12-08 LAB — CBC
HCT: 39.3 % (ref 36.0–46.0)
Hemoglobin: 13 g/dL (ref 12.0–15.0)
MCH: 29.8 pg (ref 26.0–34.0)
MCHC: 33.1 g/dL (ref 30.0–36.0)
MCV: 90.1 fL (ref 80.0–100.0)
Platelets: 271 10*3/uL (ref 150–400)
RBC: 4.36 MIL/uL (ref 3.87–5.11)
RDW: 13.1 % (ref 11.5–15.5)
WBC: 11.9 10*3/uL — ABNORMAL HIGH (ref 4.0–10.5)
nRBC: 0 % (ref 0.0–0.2)

## 2019-12-08 LAB — COMPREHENSIVE METABOLIC PANEL
ALT: 12 U/L (ref 0–44)
AST: 27 U/L (ref 15–41)
Albumin: 2.9 g/dL — ABNORMAL LOW (ref 3.5–5.0)
Alkaline Phosphatase: 204 U/L — ABNORMAL HIGH (ref 38–126)
Anion gap: 12 (ref 5–15)
BUN: 13 mg/dL (ref 6–20)
CO2: 20 mmol/L — ABNORMAL LOW (ref 22–32)
Calcium: 8.8 mg/dL — ABNORMAL LOW (ref 8.9–10.3)
Chloride: 105 mmol/L (ref 98–111)
Creatinine, Ser: 0.78 mg/dL (ref 0.44–1.00)
GFR, Estimated: >60 mL/min (ref >60)
Glucose, Bld: 126 mg/dL — ABNORMAL HIGH (ref 70–99)
Potassium: 4.1 mmol/L (ref 3.5–5.1)
Sodium: 137 mmol/L (ref 135–145)
Total Bilirubin: 1 mg/dL (ref 0.3–1.2)
Total Protein: 6.3 g/dL — ABNORMAL LOW (ref 6.5–8.1)

## 2019-12-08 MED ORDER — SILVER NITRATE-POT NITRATE 75-25 % EX MISC: 1.0000 "application " | CUTANEOUS | Status: DC | PRN

## 2019-12-08 MED ORDER — TETANUS-DIPHTH-ACELL PERTUSSIS 5-2.5-18.5 LF-MCG/0.5 IM SUSP: 0.5000 mL | Freq: Once | INTRAMUSCULAR | Status: DC

## 2019-12-08 MED ORDER — PRENATAL MULTIVITAMIN CH
1.0000 | ORAL_TABLET | Freq: Every day | ORAL | Status: DC
  Administered 2019-12-09 – 2019-12-10 (×2): 1 via ORAL
  Filled 2019-12-08 (×2): qty 1

## 2019-12-08 MED ORDER — OXYTOCIN 10 UNIT/ML IJ SOLN
INTRAMUSCULAR | Status: AC
  Filled 2019-12-08: qty 1

## 2019-12-08 MED ORDER — BENZOCAINE-MENTHOL 20-0.5 % EX AERO
1.0000 "application " | INHALATION_SPRAY | CUTANEOUS | Status: DC | PRN
  Administered 2019-12-09: 1 via TOPICAL
  Filled 2019-12-08: qty 56

## 2019-12-08 MED ORDER — SIMETHICONE 80 MG PO CHEW: 80.0000 mg | CHEWABLE_TABLET | ORAL | Status: DC | PRN

## 2019-12-08 MED ORDER — IBUPROFEN 600 MG PO TABS
600.0000 mg | ORAL_TABLET | Freq: Four times a day (QID) | ORAL | Status: DC
  Administered 2019-12-09 – 2019-12-10 (×7): 600 mg via ORAL
  Filled 2019-12-08 (×7): qty 1

## 2019-12-08 MED ORDER — COCONUT OIL OIL: 1.0000 "application " | TOPICAL_OIL | Status: DC | PRN

## 2019-12-08 MED ORDER — ONDANSETRON HCL 4 MG PO TABS
4.0000 mg | ORAL_TABLET | ORAL | Status: DC | PRN
  Administered 2019-12-09: 4 mg via ORAL
  Filled 2019-12-08: qty 1

## 2019-12-08 MED ORDER — SENNOSIDES-DOCUSATE SODIUM 8.6-50 MG PO TABS
2.0000 | ORAL_TABLET | ORAL | Status: DC
  Administered 2019-12-09: 2 via ORAL
  Filled 2019-12-08: qty 2

## 2019-12-08 MED ORDER — WITCH HAZEL-GLYCERIN EX PADS: 1.0000 "application " | MEDICATED_PAD | CUTANEOUS | Status: DC | PRN

## 2019-12-08 MED ORDER — DIPHENHYDRAMINE HCL 25 MG PO CAPS: 25.0000 mg | ORAL_CAPSULE | Freq: Four times a day (QID) | ORAL | Status: DC | PRN

## 2019-12-08 MED ORDER — DIBUCAINE (PERIANAL) 1 % EX OINT: 1.0000 "application " | TOPICAL_OINTMENT | CUTANEOUS | Status: DC | PRN

## 2019-12-08 MED ORDER — ONDANSETRON HCL 4 MG/2ML IJ SOLN: 4.0000 mg | INTRAMUSCULAR | Status: DC | PRN

## 2019-12-08 MED ORDER — ACETAMINOPHEN 325 MG PO TABS: 650.0000 mg | ORAL_TABLET | ORAL | Status: DC | PRN

## 2019-12-08 MED ORDER — ZOLPIDEM TARTRATE 5 MG PO TABS
5.0000 mg | ORAL_TABLET | Freq: Every evening | ORAL | Status: DC | PRN
  Administered 2019-12-09: 5 mg via ORAL
  Filled 2019-12-08: qty 1

## 2019-12-09 LAB — ETHANOL: Alcohol, Ethyl (B): <10 mg/dL (ref <10)

## 2019-12-09 MED ORDER — NICOTINE 14 MG/24HR TD PT24
14.0000 mg | MEDICATED_PATCH | Freq: Every day | TRANSDERMAL | Status: DC
  Administered 2019-12-09 – 2019-12-10 (×2): 14 mg via TRANSDERMAL
  Filled 2019-12-09 (×2): qty 1

## 2019-12-09 MED ORDER — HYDROXYZINE HCL 50 MG PO TABS: 25.0000 mg | ORAL_TABLET | Freq: Three times a day (TID) | ORAL | Status: DC | PRN

## 2019-12-09 MED ORDER — ALPRAZOLAM 0.25 MG PO TABS
0.5000 mg | ORAL_TABLET | Freq: Two times a day (BID) | ORAL | Status: DC | PRN
  Administered 2019-12-09 – 2019-12-10 (×3): 0.5 mg via ORAL
  Filled 2019-12-09 (×3): qty 2

## 2019-12-09 MED ORDER — GABAPENTIN 100 MG PO CAPS
100.0000 mg | ORAL_CAPSULE | Freq: Three times a day (TID) | ORAL | Status: DC
  Administered 2019-12-09 – 2019-12-10 (×4): 100 mg via ORAL
  Filled 2019-12-09 (×4): qty 1

## 2019-12-09 MED ORDER — SERTRALINE HCL 20 MG/ML PO CONC: 25.0000 mg | Freq: Every day | ORAL | Status: DC

## 2019-12-09 MED ORDER — BUPRENORPHINE HCL-NALOXONE HCL 2-0.5 MG SL SUBL: 1.0000 | SUBLINGUAL_TABLET | SUBLINGUAL | Status: AC | PRN

## 2019-12-09 MED ORDER — BUPRENORPHINE HCL-NALOXONE HCL 8-2 MG SL SUBL
1.0000 | SUBLINGUAL_TABLET | Freq: Two times a day (BID) | SUBLINGUAL | Status: DC
  Administered 2019-12-09 – 2019-12-10 (×3): 1 via SUBLINGUAL
  Filled 2019-12-09 (×3): qty 1

## 2019-12-09 MED ORDER — RHO D IMMUNE GLOBULIN 1500 UNIT/2ML IJ SOSY
300.0000 ug | PREFILLED_SYRINGE | Freq: Once | INTRAMUSCULAR | Status: AC
  Administered 2019-12-09: 300 ug via INTRAMUSCULAR
  Filled 2019-12-09: qty 2

## 2019-12-09 MED ORDER — SERTRALINE HCL 50 MG PO TABS
50.0000 mg | ORAL_TABLET | Freq: Every day | ORAL | Status: DC
  Administered 2019-12-09 – 2019-12-10 (×2): 50 mg via ORAL
  Filled 2019-12-09 (×2): qty 1

## 2019-12-09 NOTE — Clinical Social Work Maternal (Signed)
CLINICAL SOCIAL WORK MATERNAL/CHILD NOTE  Patient Details  Name: Elaine Wallace MRN: [id number] Date of Birth: [birth year]  Date:  12/09/2019  Clinical Social Worker Initiating Note:  Elaine Wallace, MSW, LCSW Date/Time: Initiated:  12/09/19/1530     Child's Name:  Parents unsure of name   Biological Parents:  Mother, Father Elaine Wallace Elaine Wallace, 07/17/1960, [phone])   Need for Interpreter:  None   Reason for Referral:  Current Substance Use/Substance Use During Pregnancy    Address:  [address] [place] [address]    Phone number:  [phone] (home)     Additional phone number: none offered  Household Members/Support Persons (HM/SP):       HM/SP Name Relationship DOB or Age  HM/SP -1        HM/SP -2        HM/SP -3        HM/SP -4        HM/SP -5        HM/SP -6        HM/SP -7        HM/SP -8          Natural Supports (not living in the home):  Immediate Family, Spouse/significant other (Elaine, Elaine Wallace, [phone], and FOB)   Professional Supports: Therapist Elaine Wallace with [facility] [phone]. Has not talked to him since before [place] pandemic)   Employment: Unemployed   Type of Work:     Education:  Programmer, systems   Homebound arranged:    Museum/gallery curator Resources:  Medicaid   Other Resources:      Cultural/Religious Considerations Which May Impact Care: none stated  Strengths:  Psychotropic Medications   Psychotropic Medications:  Zoloft, Suboxone (precribed since admission)      Pediatrician:       Pediatrician List:   [place]      Pediatrician Fax Number:    Risk Factors/Current Problems:  DHHS Involvement , Compliance with Treatment , Substance Use    Cognitive State:  Distractible , Flight of Ideas , Tangential   Mood/Affect:  Labile , Tearful , Anxious , Animated   CSW Assessment: CSW received  consult for IVDU-Heroin, ETOH abuse, and desires treatment .  CSW met with MOB at bedside to offer support and complete assessment. On arrival, CSW introduced self and stated reason for visit. MOB was alone in room until FOB join at end of visit with PPD/A and SIDS education was being provided. MOB was tangential in thought but cooperative.    CSW explained hospital drug screen policy, infant's positive drug screen (amphetmines, benzodiazepines, opiates, and cocaine), pending CDS results, and warranted CPS report. MOB confirmed Xanax, heroin, Subutex, and Suboxone use. MOB denied any other substances. MOB explained the Xanax is prescribed and the other were purchase from "a lady I know". MOB reported last use as heroin 24-48 hours ago. MOB is currently on active Rx Suboxone while admitted. MOB explained discomfort of withdrawal and efforts to reduce use while pregnant. CSW validated feelign and offered support. MOB stated plan to go into inpatient treatment with [facility] after discharge. MOB stated desire for infant to go home with FOB, Elaine Wallace [phone]) or Elaine Wallace([phone]). MOB reported other children are not in her custody. Son, Elaine Wallace(10/31/2011) is with his father Elaine Wallace([phone]) in [place], and daughter  Elaine Wallace(04-15-2008) is with a lady name Elaine Wallace in [place]. MOB stated Elaine Porch is with Elaine Wallace because [place] father lost custody. MOB denies loss of paternal rights for any of her children.  CSW assessed for mental health dx. MOB reported hx of anxiety. MOB denied any other dx beyond polysubstance abuse and anxiety. MOB reports she has been on Zoloft since admission. MOB identified current mood as "relaxed" and reported coping skills as house cleaning, gardening, and shopping with mom at [facility]. MOB denied any SI, HI, or domestic violence. MOB reports therapy with Donnella Sham at [phone]) but not since before  COVID pandemic. MOB declined new referral. CSW offered to call Arca with MOB however, MOB stated she wants to go see infant in NICU with father first. CSW will make new attempt in the morning.   CSW provided education regarding the baby blues period vs. perinatal mood disorders, discussed treatment and gave resources for mental health follow up if concerns arise.  CSW recommends self-evaluation during the postpartum time period using the New Mom Checklist from Postpartum Progress and encouraged MOB and FOB to contact a medical professional if symptoms are noted at any time. MOB and FOB stated understanding and denied any questions.    CSW provided review of Sudden Infant Death Syndrome (SIDS) precautions.  MOB and FOB stated understanding and denied nay questions. MOB confirmed having all needed items for baby including car seat and bassinet for baby's safe sleep.    CSW Plan/Description:  Sudden Infant Death Syndrome (SIDS) Education, Perinatal Mood and Anxiety Disorder (PMADs) Education, Hospital Drug Screen Policy Information, Child Protective Service Report , CSW Awaiting CPS Disposition Plan, CSW Will Continue to Monitor Umbilical Cord Tissue Drug Screen Results and Make Report if Warranted, Psychosocial Support and Ongoing Assessment of Needs CSW contacted [facility] CPS to complete report. CSW spoke with CPS worker Elaine Wallace. Elaine Wallace called later to confirmed report was screened in and transferred to [facility]. [place] worker Ms. McNair called and agreed to call back once report is reviewed. CSW will f/u in the morning regarding determination.     Elaine Wallace D. Elaine Wallace, MSW, LCSW Clinical Social Worker [phone] 12/09/2019, 4:34 PM

## 2019-12-09 NOTE — H&P (Addendum)
Elaine Wallace is a 39 y.o. female presenting for labor and subsequently delivered in MAU.  Patient reports contractions and urge to push, but was unable to give time of onset.  Patient has not received formal prenatal care during this delivery. Pregnancy and medical history significant for problems as listed below. She is GBS unknown.  She was admitted on 9/2, for suspected heroin OD, and stayed for one night before leaving AMA.  She received anatomy US and one dose of BMZ during that time.   Of note, patient appears arrived with can of beer and smells of alcohol.  However, she denies recent usage and states the beer was someone else's.  Patient also reports last drug usage was 3 days ago.  Prior to transfer to antepartum unit, patient surrendered 2 cigarettes and half of a peach pill that was patient states is suboxone.  Patient adamantly states that she would like to be sent to detox after discharge.   OB History    Gravida  4   Para  2   Term  2   Preterm      AB  1   Living  2     SAB  1   TAB      Ectopic      Multiple      Live Births  2          Past Medical History:  Diagnosis Date  . Abnormal Pap smear   . Anxiety   . Broken neck (HCC) 2005  . Carpal tunnel syndrome    reports needed surgery, was taking pain pills and got addicted to them  . Drug addiction (HCC) 2021  . Herpes   . Shingles 02/2018  . Vaginal Pap smear, abnormal    HPV   Past Surgical History:  Procedure Laterality Date  . NO PAST SURGERIES     Family History: family history includes Alcohol abuse in her maternal grandfather and maternal grandmother; COPD in her father and maternal grandmother; Cancer in her paternal grandfather; Depression in her mother; Diabetes in her paternal grandmother. Social History:  reports that she has been smoking. She has never used smokeless tobacco. She reports previous alcohol use. She reports current drug use. Frequency: 14.00 times per week. Drug:  Heroin.     Maternal Diabetes: None Genetic Screening: Declined Maternal Ultrasounds/Referrals: Normal Fetal Ultrasounds or other Referrals:  None Maternal Substance Abuse:  Yes:  Type: Prescription drugs, Other: Heroin Significant Maternal Medications:  None Significant Maternal Lab Results:  Rh negative Other Comments:  None  Review of Systems  Unable to perform ROS: Acuity of condition   History Dilation: 10 Effacement (%): 100 Station: Plus 2 Exam by:: Isabel Caprice, RN Blood pressure 112/72, pulse 81, temperature 98 F (36.7 C), temperature source Oral, resp. rate 18, SpO2 98 %, unknown if currently breastfeeding. Maternal Exam:  Uterine Assessment: Contraction strength is moderate.  Contraction frequency is regular.   Abdomen: Patient reports no abdominal tenderness. Fundal height is AGA.   Fetal presentation: vertex  Introitus: Amniotic fluid character: meconium stained.  Pelvis: adequate for delivery.      Fetal Exam Fetal Monitor Review: Mode: hand-held doppler probe.   Baseline rate: 120.  Variability: moderate (6-25 bpm).   Pattern: accelerations present.    Fetal State Assessment: Category I - tracings are normal.     Physical Exam Constitutional:      General: She is in acute distress.     Appearance: Normal appearance.  HENT:     Head: Normocephalic and atraumatic.  Neurological:     Mental Status: She is alert and oriented to person, place, and time.  Psychiatric:        Attention and Perception: Attention normal.        Mood and Affect: Affect is labile.        Speech: Speech is slurred.        Behavior: Behavior is hyperactive.        Thought Content: Thought content normal.        Judgment: Judgment is impulsive and inappropriate.     Prenatal labs: ABO, Rh: --/--/O NEG (10/15 2209) Antibody: NEG (10/15 2209) Rubella:   RPR: NON REACTIVE (09/16 1644)  HBsAg: NON REACTIVE (09/16 1644)  HIV: Non Reactive (09/16 1644)  GBS:      Assessment/Plan: 39 year old G4P2012 SIUP at 37.2 weeks 2nd Stage Labor GBS Unknown H/O Drug Abuse-Heroin Imminent Delivery  -Delivery performed in MAU and infant to NICU. -Admit to OBSCU for postpartum course -Routine Postpartum Orders per Protocol *Add On Drug and Ethanol Screen  *SW Consult  Cherre Robins, MSN, CNM 12/09/2019, 4:40 AM

## 2019-12-09 NOTE — Discharge Summary (Signed)
Postpartum Discharge Summary     Patient Name: Elaine Wallace DOB: 01/17/1981 MRN: [id number]  Date of admission: 12/08/2019 Delivery date:12/08/2019  Delivering provider: Gavin Pound  Date of discharge: 12/10/2019  Admitting diagnosis: Vaginal delivery [O80] Intrauterine pregnancy: [redacted]w[redacted]d     Secondary diagnosis:  Active Problems:   Vaginal delivery  Additional problems: H/O Heroin Abuse    Discharge diagnosis: Term Pregnancy Delivered and Opiate use disorder                                              Post partum procedures:rhogam Augmentation: N/A Complications: None  Hospital course: Onset of Labor With Vaginal Delivery      39 y.o. yo [id number] at [redacted]w[redacted]d was admitted in Active Labor on 12/08/2019. Patient had an uncomplicated labor course as follows: Patient arrived to MAU reporting onset of contractions and urge to push.  Provider arrived to bedside and patient delivered precipitously with staff support. See delivery note for further details.  Patient appeared to be influenced by unknown substance and arrived with unopened alcoholic beverage.  Patient transferred to [facility] as infant was to NICU.   Delivery Method:Vaginal, Spontaneous  Episiotomy: None  Lacerations:  Labial  Patient had a postpartum course complicated by acute withdrawal. Improved on Zoloft, Suboxone, Xanax, Neurontin.  She is ambulating, tolerating a regular diet, passing flatus, and urinating well. Patient is discharged home in stable condition on 12/10/19. [facility] saw pt, and CPS referral made. She is given 2 week supply of meds, with explicit instructions to f/u with prescriber and consider [facility] detox. She will be given number for [facility] in [facility] to continue to write for her. Also, given Depo prior to discharge.  Newborn Data: Birth date:12/08/2019  Birth time:9:13 PM  Gender:Female  Living status:Living  Apgars:6 ,  WFUXNA:3557 g   Magnesium Sulfate received: No BMZ received:  No Rhophylac:Yes MMR:No T-DaP:no Flu: No Transfusion:No  Physical exam  Vitals:   12/09/19 2328 12/10/19 0613 12/10/19 0805 12/10/19 1243  BP: 114/75 100/69 115/75 125/74  Pulse: 76 81 73 73  Resp: $Remo'15 14 15 15  'TYQVu$ Temp: 98 F (36.7 C) 98.7 F (37.1 C) 97.8 F (36.6 C) 97.7 F (36.5 C)  TempSrc: Oral Oral Oral Oral  SpO2: 97% 97% 100% 98%   General: alert, cooperative and no distress Lochia: appropriate Uterine Fundus: firm DVT Evaluation: No evidence of DVT seen on physical exam. Labs: Lab Results  Component Value Date   WBC 11.9 (H) 12/08/2019   HGB 13.0 12/08/2019   HCT 39.3 12/08/2019   MCV 90.1 12/08/2019   PLT 271 12/08/2019   CMP Latest Ref Rng & Units 12/08/2019  Glucose 70 - 99 mg/dL 126(H)  BUN 6 - 20 mg/dL 13  Creatinine 0.44 - 1.00 mg/dL 0.78  Sodium 135 - 145 mmol/L 137  Potassium 3.5 - 5.1 mmol/L 4.1  Chloride 98 - 111 mmol/L 105  CO2 22 - 32 mmol/L 20(L)  Calcium 8.9 - 10.3 mg/dL 8.8(L)  Total Protein 6.5 - 8.1 g/dL 6.3(L)  Total Bilirubin 0.3 - 1.2 mg/dL 1.0  Alkaline Phos 38 - 126 U/L 204(H)  AST 15 - 41 U/L 27  ALT 0 - 44 U/L 12   Edinburgh Score: No flowsheet data found.   After visit meds:  Allergies as of 12/10/2019      Reactions  Penicillins Other (See Comments)   Unknown childhood reaction      Medication List    TAKE these medications   acetaminophen 325 MG tablet Commonly known as: TYLENOL Take 650 mg by mouth every 6 (six) hours as needed for mild pain or headache.   ALPRAZolam 0.5 MG tablet Commonly known as: XANAX Take 1 tablet (0.5 mg total) by mouth at bedtime as needed for anxiety.   Buprenorphine HCl-Naloxone HCl 8-2 MG Film Commonly known as: Suboxone Place 1 Film under the tongue in the morning and at bedtime for 14 days.   gabapentin 100 MG capsule Commonly known as: NEURONTIN Take 1 capsule (100 mg total) by mouth 3 (three) times daily for 14 days.   ibuprofen 600 MG tablet Commonly known as: ADVIL Take  1 tablet (600 mg total) by mouth every 6 (six) hours as needed.   sertraline 50 MG tablet Commonly known as: ZOLOFT Take 1 tablet (50 mg total) by mouth daily.        Discharge home in stable condition Infant Feeding: Bottle Infant Disposition:NICU Discharge instruction: per After Visit Summary and Postpartum booklet. Activity: Advance as tolerated. Pelvic rest for 6 weeks.  Diet: routine diet Future Appointments:No future appointments. Follow up Visit:  [place] [facility] [place] at [facility] Follow up in 4 week(s).   Specialty: Obstetrics and Gynecology Contact information: [place] [phone]             Message Sent to [facility] for PPV   Please schedule this patient for a In person postpartum visit in 4 weeks with the following provider: Any provider. Additional Postpartum F/U:None  High risk pregnancy complicated by: Drug Abuse, No Prenatal Care Delivery mode:  Vaginal, Spontaneous  Anticipated Birth Control:  PP Depo given    12/10/2019 Donnamae Jude, MD

## 2019-12-09 NOTE — Progress Notes (Signed)
Post Partum Day 1 Subjective: reports feeling sad about the baby who is in the NICU with meconium aspiration. She reports desire for SUD treatment. She denies being in acute withdrawal. She requests Xanax. She is anxious, she does have nose running. She reports she smokes heroin and cigarettes. She wants to go smoke. Requests nicotine patch. She is quite irritable and without focus.  Objective: Blood pressure (!) 96/59, pulse 82, temperature 97.7 F (36.5 C), temperature source Oral, resp. rate 16, SpO2 98 %, unknown if currently breastfeeding.  Physical Exam:  General: alert, cooperative and appears stated age 39: appropriate Uterine Fundus: soft DVT Evaluation: No evidence of DVT seen on physical exam.   Recent Labs    12/08/19 2209  HGB 13.0  HCT 39.3   Clinical Opiate Withdrawal Scale: bold applicable COWS scoring   - Resting HR:    - 0 for < 80   - 1 for 81 - 100   - 2 for 101 - 120   - 4 for > 120  - Sweating:   - 0 for no chills/flushing   - 1 for subjective chills/flushing   - 3 for beads of sweat on brow/face   - 4 for sweat streaming off of face  - Restlessness:    - 0 for able to sit still   - 1 for subjective difficulty sitting still   - 3 for frequent shifting or extraneous movement   - 5 for unable to sit still for more than a few seconds  - Pupil size:    - 0 for pinpoint or normal   - 1 for possibly larger than normal   - 2 for moderately dilated   - 5 for only iris rim visible  - Bone/joint pain:    - 0 for not present   - 1 for mild diffuse discomfort   - 2 severe diffuse aching   - 4 for objectively rubbing joints/muscles and obviously in pain  - Runny nose/tearing:    - 0 for not present   - 1 for stuffy nose/moist eyes   - 2 for nose running/tearing   - 4 for nose constantly running or tears streaming down cheeks  - GI Upset:    - 0 for no GI symptoms   - 1 for stomach cramps   - 2 for nausea or loose stool   - 3 for vomiting or  diarrhea   - 5 for multiple episodes of vomiting or diarrhea  - Tremor observation of outstretched hands:    - 0 for no tremor   - 1 for tremor can be felt but not observed   - 2 for slight tremor observable   - 4 for gross tremor or muscle twitching  - Yawning:    - 0 for no yawning   - 1 for yawning once or twice during assessment   - 2 for yawning three or more times during assessment   - 4 for yawning several times per minute  - Anxiety or irritability:    - 0 for none   - 1 for patient reports increasing irritability or anxiousness   - 2 for patient obviously irritable/anxious   - 4 for patient so irritable/anxious that assessment is difficult  - Gooseflesh:    - 0 for skin is smooth   - 3 for piloerection of skin can be felt or seen   - 5 for prominent piloerection  COWS score 11-->mild withdrawal  Assessment/Plan: Plan for  discharge tomorrow  SW to see patient to consider options for treatment Nicotine patch Begin Suboxone--order set and COWS score written Vistaril for further agitation. States she is on 1 mg Xanax bid, may need to add if cannot get comfortable.   LOS: 1 day   Reva Bores 12/09/2019, 10:33 AM

## 2019-12-09 NOTE — Progress Notes (Signed)
CSW acknowledged consult and attempted to meet with MOB. However, CSW was not able to locate MOB in her room or infant's on NICU floor.  CSW will meet with MOB at a later time.   Alida Greiner D. Dortha Kern, MSW, LCSW Clinical Social Worker [phone]

## 2019-12-09 NOTE — MAU Note (Addendum)
Upon entering the patient's room to assist the patient into her gown and begin FHR tracing, the patient requested to go to the restroom. Isabel Caprice, RN, and this RN were at bedside and assisted the patient to her feet. The was very unsteady on her feet as she walked to the restroom. The patient was asked if she could leave a urine sample and the patient stated "Yes, I will try, I need my drink, I need some ice so I can pee, I have to pee." This RN informed the patient that she could use the restroom and to be sure to catch her urine in the cup. The patient repeatedly, while slurring her words, stated "I'm trying, I need to, but I can't." Pt had several episodes of standing up from the toilet and walking around the restroom forming random sentences. The patient would stop speaking and cross her legs and begin yelling and screaming.   Several times this RN and Isabel Caprice, RN, tried to get the patient to the stretcher so that we could apply the FHR monitors and check her cervix. Meconium was noted on the restroom floor.   Once the patient got onto the stretcher, it was 2108. This RN tried to apply the FHR monitors while Isabel Caprice, RN, checked the patients cervix. An initial FHR of 120 was found and the patient's cervix was found to be complete and at +2 station.   Gerrit Heck, CNM, notified at 2110 and CNM immediately reported to bedside. The patient began actively pushing at 2111 and delivered at 2113. NICU was notified by Caprice Renshaw, RN, and NICU arrived at 2115. Gerrit Heck, CNM, ordered 55mL Oxytocin IM. Cabinet override performed. The placenta delivered at 2122.  [facility] notified of substance in the room. Instructed to notify security who would come and confiscate the substance.   The patient confessed to this RN and Isabel Caprice, RN, that she used "benzo's and fentanyl" prior to coming to the hospital. CNM notified.  The patient later on confessed to having a substance in her purse and  pulled out a pill bottle that contained a straw, half of a pill, and two cigarettes. The patient also pulled out a long screw and gave it to this RN. The patient stated that "she told me it was suboxone." [facility] and security notified and they came to confiscate the substance. Gerrit Heck, CNM, notified.  Report given to Arta Silence, RN, Therapist, sports.

## 2019-12-09 NOTE — MAU Note (Signed)
Pt presents to MAU reporting CTX and stating she feels like she needs to push. Pt very fidgety and unsteady on her feet. Pt appeared to be under the influence of an unknown substance. +FM. No VB. Meconium noticed on restroom floor. Pt reports she is unsure when the fluids began leaking. A can of Michelob Ultra was noted in the back of the wheelchair. Pt denies drinking any alcohol or using any substances at this time.

## 2019-12-10 ENCOUNTER — Encounter (HOSPITAL_COMMUNITY): Payer: Self-pay

## 2019-12-10 LAB — RH IG WORKUP (INCLUDES ABO/RH)
ABO/RH(D): O NEG
Fetal Screen: NEGATIVE
Gestational Age(Wks): 37
Unit division: 0

## 2019-12-10 LAB — RPR: RPR Ser Ql: NONREACTIVE

## 2019-12-10 MED ORDER — MEDROXYPROGESTERONE ACETATE 150 MG/ML IM SUSP: 150.0000 mg | Freq: Once | INTRAMUSCULAR | Status: AC

## 2019-12-10 MED ORDER — BUPRENORPHINE HCL-NALOXONE HCL 8-2 MG SL FILM: 1.0000 | ORAL_FILM | Freq: Two times a day (BID) | SUBLINGUAL | 0 refills | Status: AC

## 2019-12-10 MED ORDER — ALPRAZOLAM 0.5 MG PO TABS: 0.5000 mg | ORAL_TABLET | Freq: Every evening | ORAL | 0 refills | Status: AC | PRN

## 2019-12-10 MED ORDER — GABAPENTIN 100 MG PO CAPS: 100.0000 mg | ORAL_CAPSULE | Freq: Three times a day (TID) | ORAL | 0 refills | Status: AC

## 2019-12-10 MED ORDER — GABAPENTIN 100 MG PO CAPS
100.0000 mg | ORAL_CAPSULE | Freq: Three times a day (TID) | ORAL | 0 refills | Status: DC
Start: 2019-12-10 — End: 2019-12-10

## 2019-12-10 MED ORDER — BUPRENORPHINE HCL-NALOXONE HCL 8-2 MG SL FILM: 1.0000 | ORAL_FILM | Freq: Two times a day (BID) | SUBLINGUAL | 0 refills | Status: DC

## 2019-12-10 MED ORDER — SERTRALINE HCL 50 MG PO TABS: 50.0000 mg | ORAL_TABLET | Freq: Every day | ORAL | 1 refills | Status: AC

## 2019-12-10 MED ORDER — MEDROXYPROGESTERONE ACETATE 150 MG/ML IM SUSP
150.0000 mg | INTRAMUSCULAR | Status: DC
  Administered 2019-12-10: 150 mg via INTRAMUSCULAR
  Filled 2019-12-10: qty 1

## 2019-12-10 MED ORDER — ALPRAZOLAM 0.5 MG PO TABS: 0.5000 mg | ORAL_TABLET | Freq: Every evening | ORAL | 0 refills | Status: DC | PRN

## 2019-12-10 NOTE — Discharge Instructions (Signed)
Elaine Wallace [place] locations [facility]  [address], [place], [place] [id number] Monday-Wednesday: 9:00am - 5:00pm Thursday: 9:00am - 6:00pm Friday: 9:00am - 5:00pm Saturday: 9:00am - 1:00pm Sunday: Closed  [phone]  At [facility], we provide compassionate, evidence-based care for people affected by addiction and are dedicated to transforming the quality, delivery, and accessibility of treatment. Every member of our team works together integrating care and coordinating services to improve our members' quality of life.  Postpartum Care After Vaginal Delivery This sheet gives you information about how to care for yourself from the time you deliver your baby to up to 6-12 weeks after delivery (postpartum period). Your health care provider may also give you more specific instructions. If you have problems or questions, contact your health care provider. Follow these instructions at home: Vaginal bleeding  It is normal to have vaginal bleeding (lochia) after delivery. Wear a sanitary pad for vaginal bleeding and discharge. ? During the first week after delivery, the amount and appearance of lochia is often similar to a menstrual period. ? Over the next few weeks, it will gradually decrease to a dry, yellow-brown discharge. ? For most women, lochia stops completely by 4-6 weeks after delivery. Vaginal bleeding can vary from woman to woman.  Change your sanitary pads frequently. Watch for any changes in your flow, such as: ? A sudden increase in volume. ? A change in color. ? Large blood clots.  If you pass a blood clot from your vagina, save it and call your health care provider to discuss. Do not flush blood clots down the toilet before talking with your health care provider.  Do not use tampons or douches until your health care provider says this is safe.  If you are not breastfeeding, your period should return 6-8 weeks after delivery. If you are  feeding your child breast milk only (exclusive breastfeeding), your period may not return until you stop breastfeeding. Perineal care  Keep the area between the vagina and the anus (perineum) clean and dry as told by your health care provider. Use medicated pads and pain-relieving sprays and creams as directed.  If you had a cut in the perineum (episiotomy) or a tear in the vagina, check the area for signs of infection until you are healed. Check for: ? More redness, swelling, or pain. ? Fluid or blood coming from the cut or tear. ? Warmth. ? Pus or a bad smell.  You may be given a squirt bottle to use instead of wiping to clean the perineum area after you go to the bathroom. As you start healing, you may use the squirt bottle before wiping yourself. Make sure to wipe gently.  To relieve pain caused by an episiotomy, a tear in the vagina, or swollen veins in the anus (hemorrhoids), try taking a warm sitz bath 2-3 times a day. A sitz bath is a warm water bath that is taken while you are sitting down. The water should only come up to your hips and should cover your buttocks. Breast care  Within the first few days after delivery, your breasts may feel heavy, full, and uncomfortable (breast engorgement). Milk may also leak from your breasts. Your health care provider can suggest ways to help relieve the discomfort. Breast engorgement should go away within a few days.  If you are breastfeeding: ? Wear a bra that supports your breasts and fits you well. ? Keep your nipples clean and dry. Apply creams and ointments as  told by your health care provider. ? You may need to use breast pads to absorb milk that leaks from your breasts. ? You may have uterine contractions every time you breastfeed for up to several weeks after delivery. Uterine contractions help your uterus return to its normal size. ? If you have any problems with breastfeeding, work with your health care provider or Hotel manager.  If you are not breastfeeding: ? Avoid touching your breasts a lot. Doing this can make your breasts produce more milk. ? Wear a good-fitting bra and use cold packs to help with swelling. ? Do not squeeze out (express) milk. This causes you to make more milk. Intimacy and sexuality  Ask your health care provider when you can engage in sexual activity. This may depend on: ? Your risk of infection. ? How fast you are healing. ? Your comfort and desire to engage in sexual activity.  You are able to get pregnant after delivery, even if you have not had your period. If desired, talk with your health care provider about methods of birth control (contraception). Medicines  Take over-the-counter and prescription medicines only as told by your health care provider.  If you were prescribed an antibiotic medicine, take it as told by your health care provider. Do not stop taking the antibiotic even if you start to feel better. Activity  Gradually return to your normal activities as told by your health care provider. Ask your health care provider what activities are safe for you.  Rest as much as possible. Try to rest or take a nap while your baby is sleeping. Eating and drinking   Drink enough fluid to keep your urine pale yellow.  Eat high-fiber foods every day. These may help prevent or relieve constipation. High-fiber foods include: ? Whole grain cereals and breads. ? Brown rice. ? Beans. ? Fresh fruits and vegetables.  Do not try to lose weight quickly by cutting back on calories.  Take your prenatal vitamins until your postpartum checkup or until your health care provider tells you it is okay to stop. Lifestyle  Do not use any products that contain nicotine or tobacco, such as cigarettes and e-cigarettes. If you need help quitting, ask your health care provider.  Do not drink alcohol, especially if you are breastfeeding. General instructions  Keep all follow-up  visits for you and your baby as told by your health care provider. Most women visit their health care provider for a postpartum checkup within the first 3-6 weeks after delivery. Contact a health care provider if:  You feel unable to cope with the changes that your child brings to your life, and these feelings do not go away.  You feel unusually sad or worried.  Your breasts become red, painful, or hard.  You have a fever.  You have trouble holding urine or keeping urine from leaking.  You have little or no interest in activities you used to enjoy.  You have not breastfed at all and you have not had a menstrual period for 12 weeks after delivery.  You have stopped breastfeeding and you have not had a menstrual period for 12 weeks after you stopped breastfeeding.  You have questions about caring for yourself or your baby.  You pass a blood clot from your vagina. Get help right away if:  You have chest pain.  You have difficulty breathing.  You have sudden, severe leg pain.  You have severe pain or cramping in your lower abdomen.  You bleed from your vagina so much that you fill more than one sanitary pad in one hour. Bleeding should not be heavier than your heaviest period.  You develop a severe headache.  You faint.  You have blurred vision or spots in your vision.  You have bad-smelling vaginal discharge.  You have thoughts about hurting yourself or your baby. If you ever feel like you may hurt yourself or others, or have thoughts about taking your own life, get help right away. You can go to the nearest emergency department or call:  Your local emergency services (911 in the [place]  A suicide crisis helpline, such as the National Suicide Prevention Lifeline at (734) 706-8893. This is open 24 hours a day. Summary  The period of time right after you deliver your newborn up to 6-12 weeks after delivery is called the postpartum period.  Gradually return to your  normal activities as told by your health care provider.  Keep all follow-up visits for you and your baby as told by your health care provider. This information is not intended to replace advice given to you by your health care provider. Make sure you discuss any questions you have with your health care provider. Document Revised: 02/12/2017 Document Reviewed: 11/23/2016 Elsevier Patient Education  2020 Elsevier Inc. Buprenorphine buccal film What is this medicine? BUPRENORPHINE (byoo pre NOR feen) is a pain reliever. It is used to treat moderate to severe pain. This medicine may be used for other purposes; ask your health care provider or pharmacist if you have questions. COMMON BRAND NAME(S): Belbuca What should I tell my health care provider before I take this medicine? They need to know if you have any of these conditions:  blockage in your bowel  brain tumor  drink more than 3 alcohol-containing drinks per day  drug abuse or addiction  head injury  kidney disease  liver disease  lung or breathing disease, like asthma  mouth sores  thyroid disease  trouble passing urine or change in the amount of urine  an unusual or allergic reaction to buprenorphine, other medicines, foods, dyes, or preservatives  pregnant or trying to get pregnant  breast-feeding How should I use this medicine? Take this medicine by mouth. Follow the directions on the prescription label. Wet the inside of your cheek with tongue or rinse mouth with water before using this medicine. Open package with dry hands just before you are ready to use. Do not cut or tear the film. Use the tip of a dry finger to put film in the mouth with the yellow side of the film facing the cheek. Hold the film in place for 5 seconds. After you place the medicine on your cheek leave the film in place until it dissolves away in about 30 minutes. Do not move or touch the film with fingers or tongue. Do not eat or drink until the  film has dissolved. Do not chew or swallow the film. Take your medicine at regular intervals. Do not take your medicine more often than directed. Do not stop taking except on your doctor's advice. A special MedGuide will be given to you by the pharmacist with each prescription and refill. Be sure to read this information carefully each time. Talk to your pediatrician regarding the use of this medicine in children. Special care may be needed. Overdosage: If you think you have taken too much of this medicine contact a poison control center or emergency room at once. NOTE: This medicine is only  for you. Do not share this medicine with others. What if I miss a dose? If you miss a dose, take it as soon as you can. If it is almost time for your next dose, take only that dose. Do not take double or extra doses. What may interact with this medicine? Do not take this medication with any of the following medicines:  cisapride  certain medicines for fungal infections like ketoconazole and itraconazole  dronedarone  pimozide  thioridazine This medicine may interact with the following medications:  alcohol  antihistamines for allergy, cough and cold  antiviral medicines for HIV or AIDS  atropine  certain antibiotics like clarithromycin, erythromycin, linezolid, rifampin  certain medicines for anxiety or sleep  certain medicines for bladder problems like oxybutynin, tolterodine  certain medicines for depression like amitriptyline, fluoxetine, sertraline  certain medicines for migraine headache like almotriptan, eletriptan, frovatriptan, naratriptan, rizatriptan, sumatriptan, zolmitriptan  certain medicines for nausea or vomiting like dolasetron, ondansetron, palonosetron  certain medicines for Parkinson's disease like benztropine, trihexyphenidyl  certain medicines for seizures like phenobarbital, primidone  certain medicines for stomach problems like cimetidine, dicyclomine,  hyoscyamine  certain medicines for travel sickness like scopolamine  diuretics  dofetilide  general anesthetics like halothane, isoflurane, methoxyflurane, propofol  ipratropium  local anesthetics like lidocaine, pramoxine, tetracaine  MAOIs like Carbex, Eldepryl, Marplan, Nardil, and Parnate  medicines that relax muscles for surgery  methylene blue  other medicines that prolong the QT interval (cause an abnormal heart rhythm)  other narcotic medicines for pain or cough  phenothiazines like chlorpromazine, mesoridazine, prochlorperazine  ritonavir  ziprasidone This list may not describe all possible interactions. Give your health care provider a list of all the medicines, herbs, non-prescription drugs, or dietary supplements you use. Also tell them if you smoke, drink alcohol, or use illegal drugs. Some items may interact with your medicine. What should I watch for while using this medicine? Tell your health care provider if your pain does not go away, if it gets worse, or if you have new or a different type of pain. You may develop tolerance to this drug. Tolerance means that you will need a higher dose of the drug for pain relief. Tolerance is normal and is expected if you take this drug for a long time. Do not suddenly stop taking your drug because you may develop a severe reaction. Your body becomes used to the drug. This does NOT mean you are addicted. Addiction is a behavior related to getting and using a drug for a nonmedical reason. If you have pain, you have a medical reason to take pain drug. Your health care provider will tell you how much drug to take. If your health care provider wants you to stop the drug, the dose will be slowly lowered over time to avoid any side effects. If you take other drugs that also cause drowsiness like other narcotic pain drugs, benzodiazepines, or other drugs for sleep, you may have more side effects. Give your health care provider a list of  all drugs you use. He or she will tell you how much drug to take. Do not take more drug than directed. Get emergency help right away if you have trouble breathing or are unusually tired or sleepy. Talk to your health care provider about naloxone and how to get it. Naloxone is an emergency drug used for an opioid overdose. An overdose can happen if you take too much opioid. It can also happen if an opioid is taken with  some other drugs or substances, like alcohol. Know the symptoms of an overdose, like trouble breathing, unusually tired or sleepy, or not being able to respond or wake up. Make sure to tell caregivers and close contacts where it is stored. Make sure they know how to use it. After naloxone is given, you must get emergency help right away. Naloxone is a temporary treatment. Repeat doses may be needed. You may get drowsy or dizzy. Do not drive, use machinery, or do anything that needs mental alertness until you know how this drug affects you. Do not stand up or sit up quickly, especially if you are an older patient. This reduces the risk of dizzy or fainting spells. Alcohol may interfere with the effect of this drug. Avoid alcoholic drinks. This drug will cause constipation. If you do not have a bowel movement for 3 days, call your health care provider. Your mouth may get dry. Chewing sugarless gum or sucking hard candy and drinking plenty of water may help. Contact your health care provider if the problem does not go away or is severe. What side effects may I notice from receiving this medicine? Side effects that you should report to your doctor or health care professional as soon as possible:  allergic reactions like skin rash, itching or hives, swelling of the face, lips, or tongue  breathing problems  confusion  signs and symptoms of a dangerous change in heartbeat or heart rhythm like chest pain; dizziness; fast or irregular heartbeat; palpitations; feeling faint or lightheaded, falls;  breathing problems  signs and symptoms of liver injury like dark yellow or brown urine; general ill feeling or flu-like symptoms; light-colored stools; loss of appetite; nausea; right upper belly pain; unusually weak or tired; yellow of the eyes or skin  signs and symptoms of low blood pressure like dizziness; feeling faint or lightheaded, falls; unusually weak or tired  trouble passing urine or change in the amount of urine Side effects that usually do not require medical attention (report to your doctor or health care professional if they continue or are bothersome):  constipation  dry mouth  nausea, vomiting  tiredness This list may not describe all possible side effects. Call your doctor for medical advice about side effects. You may report side effects to FDA at 1-800-FDA-1088. Where should I keep my medicine? Keep out of the reach of children. This medicine can be abused. Keep your medicine in a safe place to protect it from theft. Do not share this medicine with anyone. Selling or giving away this medicine is dangerous and against the law. Follow the directions in the MedGuide. Store at room temperature between 15 and 30 degrees C (59 and 86 degrees F). This medicine may cause accidental overdose and death if it is taken by other adults, children, or pets. Return medicine that has not been used to an official disposal site. Contact the DEA at 7474117903 or your city/county government to find a site. If you cannot return the medicine, remove any unused films from the foil packs and flush any down the toilet to reduce the chance of harm. Throw away the empty foil packaging in the trash. Do not use the medicine after the expiration date. NOTE: This sheet is a summary. It may not cover all possible information. If you have questions about this medicine, talk to your doctor, pharmacist, or health care provider.  2020 Elsevier/Gold Standard (2018-09-20 11:41:05)

## 2019-12-10 NOTE — Progress Notes (Signed)
CSW provided MOB with Methadone, Suboxone, Buprenorphine treatment locations for [place] and [place]. MOB stated plans to f/u with one of the locations next week.   CSW escorted [facility] CPS worker Baird Lyons to [facility] room (110) to complete safety plan with MOB and FOB. Copy of safety plan will be placed in chart. Baird Lyons stated Berton Lan will be managing investigation.   There remains barriers to infant discharging with MOB.  Pete Schnitzer D. Dortha Kern, MSW, LCSW Clinical Social Worker [phone]

## 2019-12-10 NOTE — Progress Notes (Signed)
Reviewed postpartum instructions with patient regarding medications, when to call MD/go to MAU and to call and schedule follow up appointment with OB in 4 weeks. Patient verbalized understanding of postpartum discharge instructions and asked appropriate questions.

## 2019-12-10 NOTE — Progress Notes (Addendum)
CSW visited MOB at bedside to assist MOB with calling [facility]. This was a second attempt after MOB identified the location as desired inpatient rehab choice. Today, after CSW arrived, MOB  uninterested in calling. MOB stated desire to go home for a few days first.  CSW talked with MOB and MOB agreed to call to assess bed availability. CSW spoke with [phone]) Intake Worker who stated MOB would have to attend 6-week OB check up and be cleared before admission would be considered. CSW thank them for information and ended call. CSW offered to contact other facilities with MOB, however, MOB declined. MOB kept eyes closed through most of visit and was difficult to arouse at time.   CSW spoke with covering RN Jeanice Lim who reported MOB has 2 wk Rx for Suboxone. MOP will be discharging today and infant will remain in NICU.  CSW updated NICU CSW [place] regarding MOB's status. Lawanna Kobus agreed to follow infant and continue offering MOB support. Berton Lan CPS is also involved.   There are barriers to baby discharging with MOB at this time.   Pollie Poma D. Dortha Kern, MSW, LCSW Clinical Social Worker [phone]

## 2019-12-11 ENCOUNTER — Telehealth: Payer: Self-pay | Admitting: *Deleted

## 2019-12-11 DIAGNOSIS — R52 Pain, unspecified: Secondary | ICD-10-CM

## 2019-12-11 NOTE — Telephone Encounter (Signed)
Elaine Wallace left a message this afternoon she wanted to talk with [facility] or Dr. Shawnie Pons about getting her Ibuprofen 800mg  called in.  Per chart had vaginal delivery and d/c yesterday.  Jonte Shiller,RN

## 2019-12-12 LAB — SURGICAL PATHOLOGY

## 2019-12-13 ENCOUNTER — Ambulatory Visit: Payer: Self-pay

## 2019-12-13 MED ORDER — IBUPROFEN 600 MG PO TABS: 600.0000 mg | ORAL_TABLET | Freq: Four times a day (QID) | ORAL | 0 refills | Status: AC | PRN

## 2019-12-13 NOTE — Lactation Note (Signed)
This note was copied from a baby's chart. Lactation Consultation Note  Patient Name: Elaine Wallace [id number] Date: 12/13/2019  this LC received a phone message to call  This mother due to breast feeding questions.  Baby in NICU -- 24 days old Early term - according to the NP note - Critically ill.  LC visited NICU around 1400 and spoke with the NICU RN caring for the baby  [name] and she mentioned she has not spoke to the mother or seen her in NICU visiting baby.  Mom has a history of substance abuse during this pregnancy, see Lab results for + drug screen in results review. Mom is being followed by Child psychotherapist.  Per Social worker note at 10/20 1:50 pm in regards of transportation for a breast pump [facility].  LC was able to call mom and she has obtained a DEBP and she is getting relieve from her engorgement she has had since last night , especially under her right axillary area. Per mom has pumped off 4 1/2 oz and expressed she may not be- able to feed it to the baby due to being on Gabapentin and Subutex.  Mom did not mentioned she was on any other medications.  LC discussed with mom it would be a conversation with her doctor in NICU.  LC called and reported to the NICU RN - [name] had spoken with mom and she has a DEBP.       Maternal Data    Feeding Feeding Type: Formula  LATCH Score                   Interventions    Lactation Tools Discussed/Used     Consult Status      Elaine Wallace 12/13/2019, 6:15 PM

## 2019-12-13 NOTE — Telephone Encounter (Signed)
Per discharge summary was advised to take Ibuprofen 600mg  every 6 hours as needed; but no prescription given.  I called [facility] and left a message we got her message and are sending prescription in to her pharmacy on file; let Shanda Bumps know if any issues. Joshuah Minella,RN

## 2022-07-10 IMAGING — US US MFM OB DETAIL+14 WK
1 series · 14 of 28 positions shown · non-contrast
Comparison: none

[Series 1: us mfm ob detail+14 wk · 63 acquisitions, 14 frames shown]
[im 3/63]
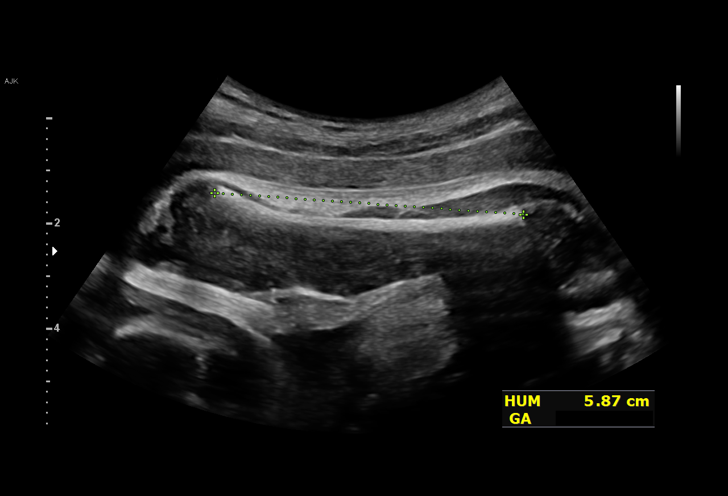
[im 7/63]
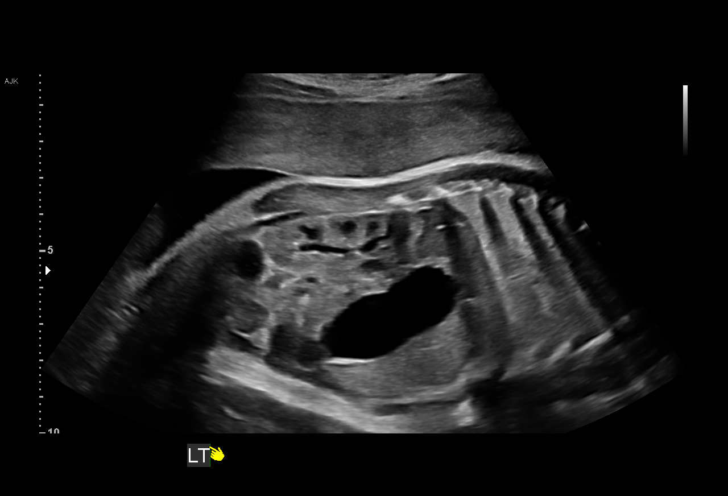
[im 12/63]
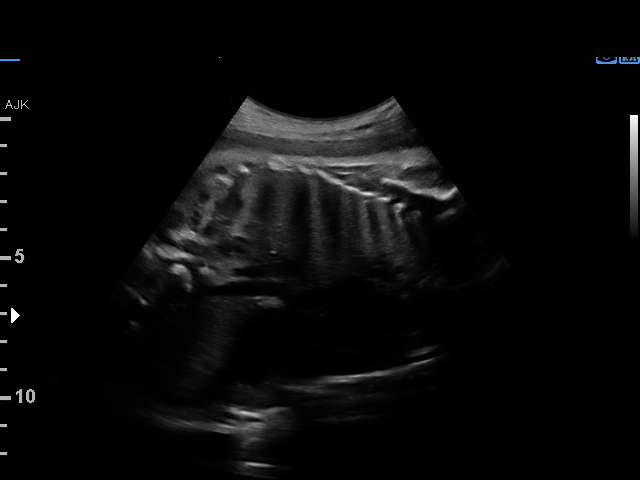
[im 17/63]
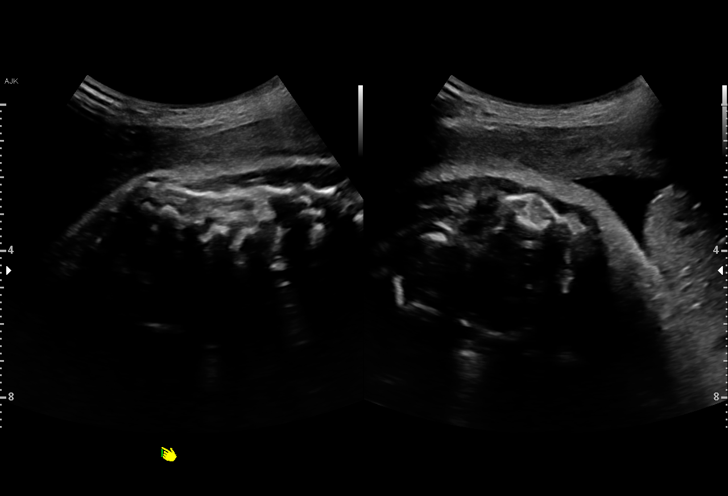
[im 21/63]
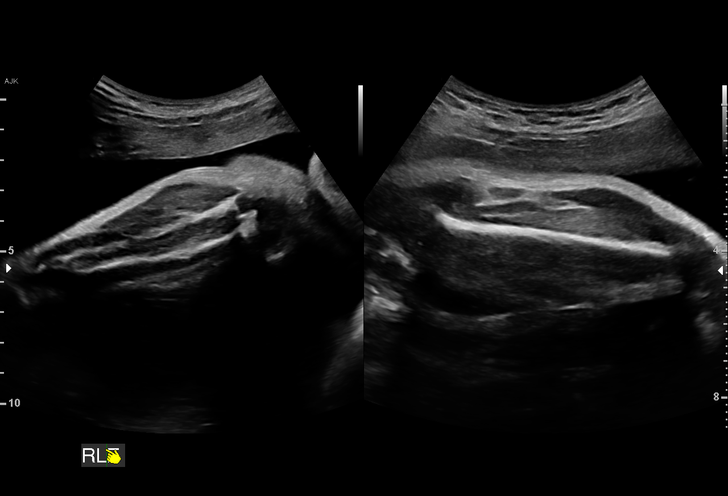
[im 26/63]
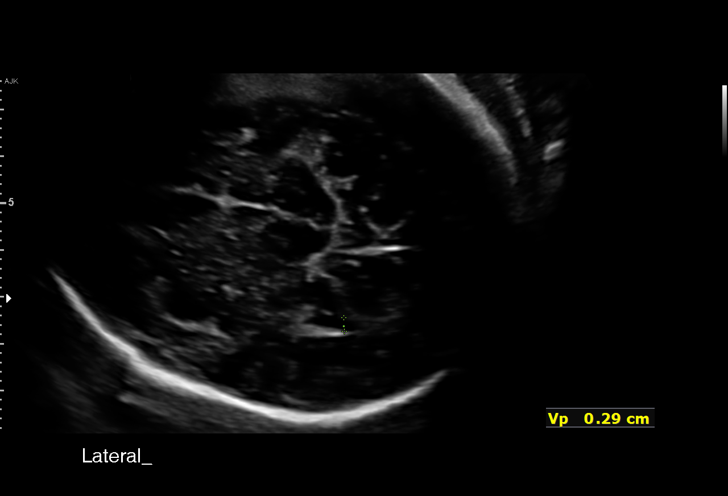
[im 30/63]
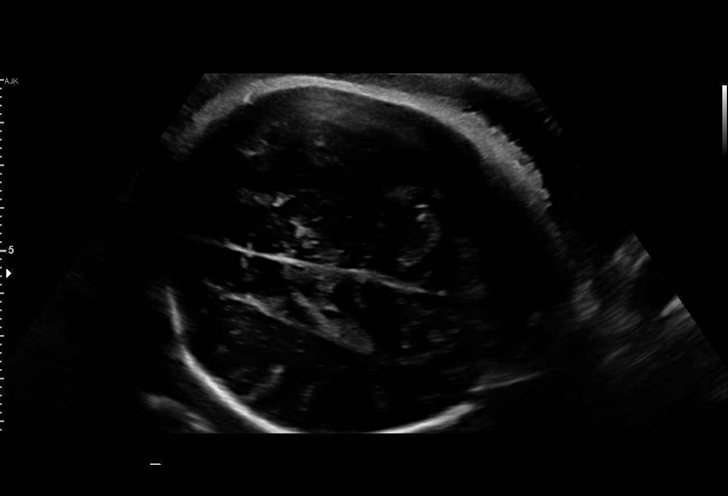
[im 35/63]
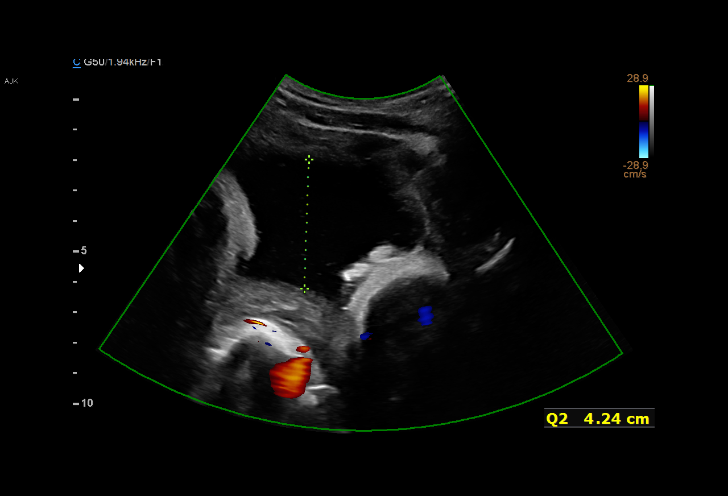
[im 40/63]
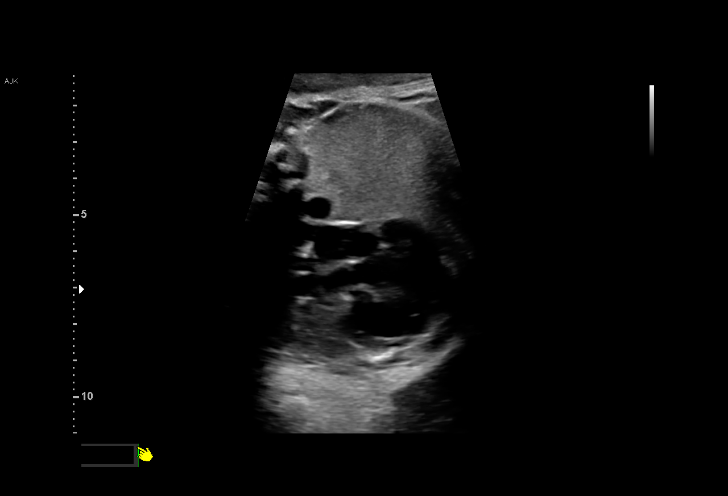
[im 44/63]
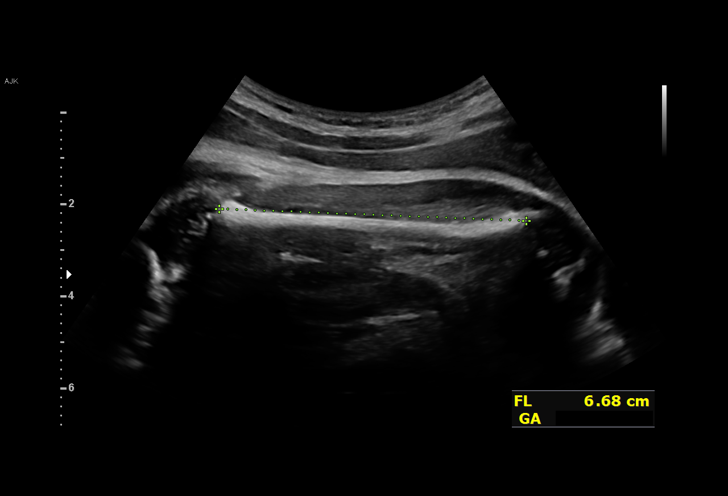
[im 49/63]
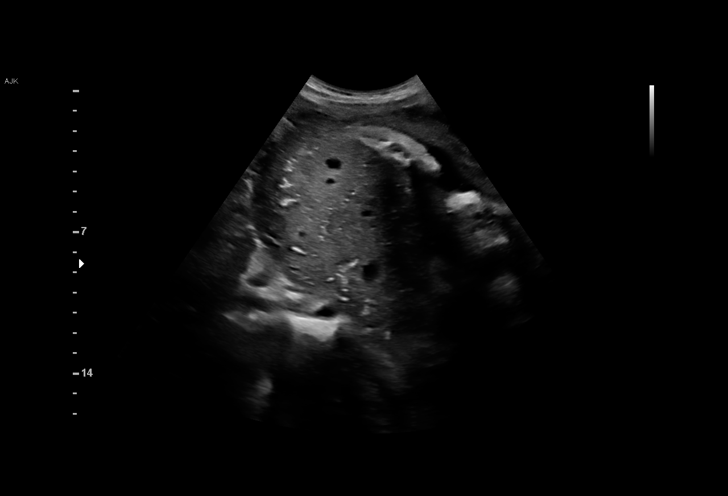
[im 53/63]
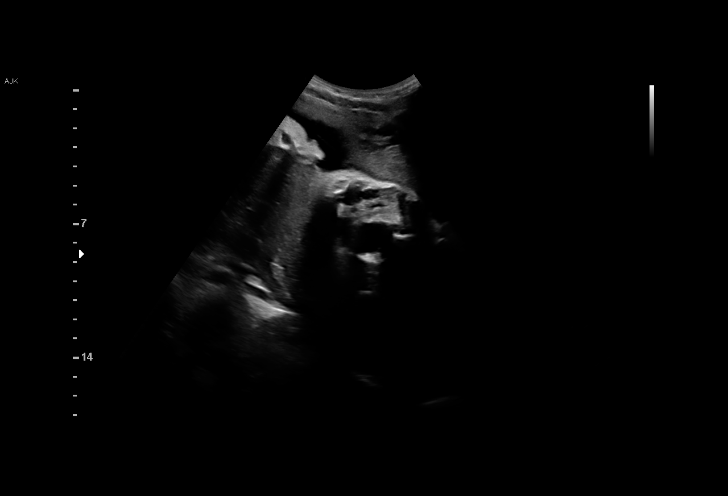
[im 58/63]
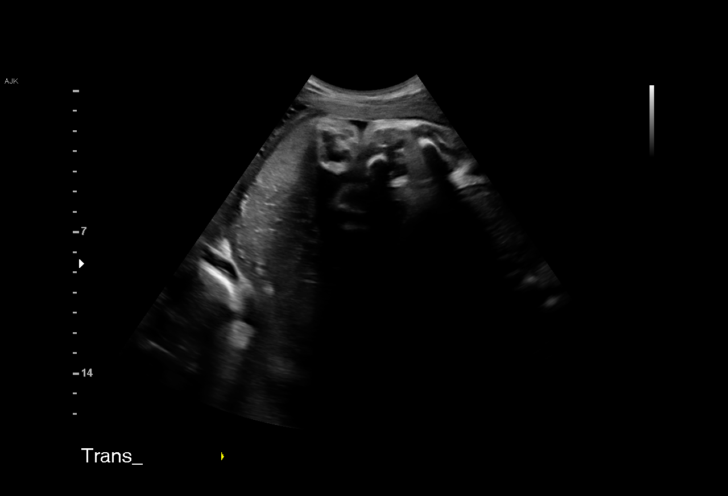
[im 63/63]
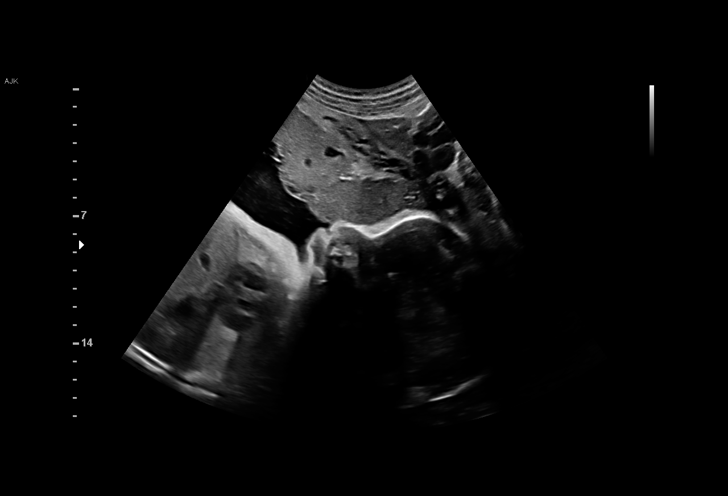

[14 of 28 positions shown; findings below may reference images not displayed]

CNM

Indications

 33 weeks gestation of pregnancy
 Drug use complicating pregnancy, third
 trimester
 Insufficient Prenatal Care (no PNC)
 Encounter for uncertain dates
 Medical complication of pregnancy (Heroin
 overdose 11/14/19)
Fetal Evaluation

 Num Of Fetuses:         1
 Fetal Heart Rate(bpm):  140
 Cardiac Activity:       Observed
 Presentation:           Cephalic
 Placenta:               Right lateral
 P. Cord Insertion:      Not well visualized

 Amniotic Fluid
 AFI FV:      Within normal limits

 AFI Sum(cm)     %Tile       Largest Pocket(cm)
 15.9            57          5

 RUQ(cm)       RLQ(cm)       LUQ(cm)        LLQ(cm)
 5
Biophysical Evaluation

 Amniotic F.V:   Within normal limits       F. Tone:        Observed
 F. Movement:    Observed                   Score:          [DATE]
 F. Breathing:   Observed
Biometry

 BPD:      82.7  mm     G. Age:  33w 2d         33  %    CI:        74.98   %    70 - 86
                                                         FL/HC:      21.7   %    19.4 -
 HC:       303   mm     G. Age:  33w 5d         15  %    HC/AC:      0.93        0.96 -
 AC:      326.7  mm     G. Age:  36w 4d         99  %    FL/BPD:     79.7   %    71 - 87
 FL:       65.9  mm     G. Age:  34w 0d         46  %    FL/AC:      20.2   %    20 - 24
 HUM:      58.6  mm     G. Age:  33w 6d         63  %
 CER:      51.5  mm     G. Age:  37w 2d         93  %
 LV:        2.9  mm

 Est. FW:    7371  gm    5 lb 13 oz      85  %
OB History

 Gravidity:    4         Term:   2         SAB:   1
 Living:       2
Gestational Age

 U/S Today:     34w 3d                                        EDD:   12/23/19
 Best:          33w 5d     Det. By:  U/S  (11/09/19)          EDD:   12/28/19
Anatomy

 Cranium:               Appears normal         LVOT:                   Appears normal
 Cavum:                 Appears normal         Aortic Arch:            Appears normal
 Ventricles:            Appears normal         Ductal Arch:            Not well visualized
 Choroid Plexus:        Appears normal         Diaphragm:              Appears normal
 Cerebellum:            Appears normal         Stomach:                Appears normal, left
                                                                       sided
 Posterior Fossa:       Appears normal         Abdomen:                Appears normal
 Nuchal Fold:           Not applicable (>20    Abdominal Wall:         Not well visualized
                        wks GA)
 Face:                  Not well visualized    Cord Vessels:           Not well visualized
 Lips:                  Appears normal         Kidneys:                Appear normal
 Palate:                Not well visualized    Bladder:                Appears normal
 Thoracic:              Appears normal         Spine:                  Appears normal
 Heart:                 Not well visualized    Upper Extremities:      Not well visualized
 RVOT:                  Not well visualized    Lower Extremities:      Not well visualized

 Other:  Technically difficult due to advanced GA and fetal position.
Impression

 G1 P0. Patient was admitted after being found unconscious
 [place] a store front. Suspected heroin overdose.
 Fetal growth is appropriate for gestational age .Amniotic fluid
 is normal and good fetal activity is seen .Fetal anatomical
 survey appears normal but limited by advanced gestational
 age.
 Antenatal testing is reassuring. BPP [DATE].
Recommendations

 Continue weekly BPP till delivery.
                 Martucci, Quisha
